# Patient Record
Sex: Female | Born: 1990 | Race: White | Hispanic: No | Marital: Single | Smoking: Current every day smoker
Health system: Southern US, Community
[De-identification: ages and names within clinical notes are randomized; demographics above are authoritative.]

## PROBLEM LIST (undated history)

## (undated) DIAGNOSIS — N83209 Unspecified ovarian cyst, unspecified side: Secondary | ICD-10-CM

## (undated) DIAGNOSIS — N289 Disorder of kidney and ureter, unspecified: Secondary | ICD-10-CM

## (undated) DIAGNOSIS — N390 Urinary tract infection, site not specified: Secondary | ICD-10-CM

## (undated) HISTORY — PX: KNEE SURGERY: SHX244

---
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
## 2011-02-15 ENCOUNTER — Emergency Department (HOSPITAL_BASED_OUTPATIENT_CLINIC_OR_DEPARTMENT_OTHER)
Admission: EM | Admit: 2011-02-15 | Discharge: 2011-02-16 | Disposition: A | Discharge status: Home / Self Care | Payer: Self-pay | Attending: Emergency Medicine | Admitting: Emergency Medicine

## 2011-02-15 ENCOUNTER — Encounter (HOSPITAL_BASED_OUTPATIENT_CLINIC_OR_DEPARTMENT_OTHER): Payer: Self-pay | Admitting: Emergency Medicine

## 2011-02-15 ENCOUNTER — Emergency Department (INDEPENDENT_AMBULATORY_CARE_PROVIDER_SITE_OTHER): Payer: Self-pay

## 2011-02-15 DIAGNOSIS — S41159A Open bite of unspecified upper arm, initial encounter: Secondary | ICD-10-CM

## 2011-02-15 DIAGNOSIS — F17200 Nicotine dependence, unspecified, uncomplicated: Secondary | ICD-10-CM | POA: Insufficient documentation

## 2011-02-15 DIAGNOSIS — Z23 Encounter for immunization: Secondary | ICD-10-CM | POA: Insufficient documentation

## 2011-02-15 DIAGNOSIS — M7989 Other specified soft tissue disorders: Secondary | ICD-10-CM

## 2011-02-15 DIAGNOSIS — S5000XA Contusion of unspecified elbow, initial encounter: Secondary | ICD-10-CM

## 2011-02-15 DIAGNOSIS — W540XXA Bitten by dog, initial encounter: Secondary | ICD-10-CM

## 2011-02-15 DIAGNOSIS — S51009A Unspecified open wound of unspecified elbow, initial encounter: Secondary | ICD-10-CM | POA: Insufficient documentation

## 2011-02-15 DIAGNOSIS — Y92009 Unspecified place in unspecified non-institutional (private) residence as the place of occurrence of the external cause: Secondary | ICD-10-CM | POA: Insufficient documentation

## 2011-02-15 DIAGNOSIS — Y998 Other external cause status: Secondary | ICD-10-CM | POA: Insufficient documentation

## 2011-02-15 DIAGNOSIS — S6990XA Unspecified injury of unspecified wrist, hand and finger(s), initial encounter: Secondary | ICD-10-CM

## 2011-02-15 LAB — CBC
MCH: 31.3 pg (ref 26.0–34.0)
MCV: 91.9 fL (ref 78.0–100.0)
WBC: 8.5 10*3/uL (ref 4.0–10.5)

## 2011-02-15 MED ORDER — RABIES IMMUNE GLOBULIN 150 UNIT/ML IM INJ
20.0000 [IU]/kg | INJECTION | Freq: Once | INTRAMUSCULAR | Status: AC
  Administered 2011-02-16: 1350 [IU]
  Filled 2011-02-15: qty 10

## 2011-02-15 MED ORDER — SODIUM CHLORIDE 0.9 % IV SOLN
3.0000 g | Freq: Once | INTRAVENOUS | Status: AC
  Administered 2011-02-15: 3 g via INTRAVENOUS
  Filled 2011-02-15: qty 3

## 2011-02-15 MED ORDER — OXYCODONE-ACETAMINOPHEN 5-325 MG PO TABS: 2.0000 | ORAL_TABLET | ORAL | Status: AC | PRN

## 2011-02-15 MED ORDER — RABIES VACCINE, PCEC IM SUSR
1.0000 mL | Freq: Once | INTRAMUSCULAR | Status: AC
  Administered 2011-02-16: 1 mL via INTRAMUSCULAR
  Filled 2011-02-15: qty 1

## 2011-02-15 MED ORDER — AMOXICILLIN-POT CLAVULANATE 875-125 MG PO TABS: 1.0000 | ORAL_TABLET | Freq: Two times a day (BID) | ORAL | Status: AC

## 2011-02-15 MED ORDER — SODIUM CHLORIDE 0.9 % IV BOLUS (SEPSIS)
1000.0000 mL | Freq: Once | INTRAVENOUS | Status: AC
  Administered 2011-02-15: 1000 mL via INTRAVENOUS

## 2011-02-15 MED ORDER — TETANUS-DIPHTH-ACELL PERTUSSIS 5-2.5-18.5 LF-MCG/0.5 IM SUSP
0.5000 mL | Freq: Once | INTRAMUSCULAR | Status: AC
  Administered 2011-02-15: 0.5 mL via INTRAMUSCULAR
  Filled 2011-02-15: qty 0.5

## 2011-02-15 NOTE — ED Notes (Signed)
[facility] at bedside 

## 2011-02-15 NOTE — ED Notes (Signed)
Pt sts she was at a friend's house last evening when the dog bit her. Pt sts the dog's owner told her that the dog's shots are current but would not tell her the house number on [address]. She got the dog's owner, Minerva Areola, on the phone to speak to me. I explained who I was and that I was required by law to report the dog bite and I needed to know his house number. Minerva Areola became belligerent and stated that he would tell the police his address but not me. I then asked if the dog's rabies shots were up to date and he stated that they were. I then asked him if he had paperwork to prove to the police that the dog has current rabies vaccination. He then broke into a tirade about how he was bitten by a dog once and he did not require treatment and then sts "I know how hospitals are. Y'all are going to milk here for all the money you can." At this point I ended the phone call with Minerva Areola.

## 2011-02-15 NOTE — ED Notes (Signed)
Animal bite reported to [facility].

## 2011-02-15 NOTE — ED Provider Notes (Signed)
History     CSN: [id number]  Arrival date & time 02/15/11  2121   First MD Initiated Contact with Patient 02/15/11 2226      Chief Complaint  Patient presents with  . Animal Bite   this is a healthy 21 year old female that was bitten by a pit spell yesterday in her right upper extremity. This happened last night, approximately 24 hours ago. She states the dog belonged to a friend and the dog had all of its shots up to date. Initially, there was no significant pain. There was one bite at the right lateral wrist. On the anterior portion and another bite at the elbow. She did go to work today and finished her shift. However, he states the bruising has become more extensive around the elbow. There is also been some mild redness around the wounds. Patient has had no fever. No significant streaking in the arm. No nausea, vomiting, or weakness. Denies any numbness or tingling. She did cleanse the wounds at home prior to coming in.  (Consider location/radiation/quality/duration/timing/severity/associated sxs/prior treatment) HPI  History reviewed. No pertinent past medical history.  Past Surgical History  Procedure Date  . Knee surgery     No family history on file.  History  Substance Use Topics  . Smoking status: Current Everyday Smoker  . Smokeless tobacco: Not on file  . Alcohol Use: Yes    OB History    Grav Para Term Preterm Abortions TAB SAB Ect Mult Living                  Review of Systems  All other systems reviewed and are negative.    Allergies  Review of patient's allergies indicates no known allergies.  Home Medications   Current Outpatient Rx  Name Route Sig Dispense Refill  . BIOTIN 10 MG PO TABS Oral Take 2 tablets by mouth daily.      BP 156/83  Pulse 93  Temp 98.5 F (36.9 C)  Resp 18  Ht 5\' 8"  (1.727 m)  Wt 150 lb (68.04 kg)  BMI 22.81 kg/m2  SpO2 99%  Physical Exam  Nursing note and vitals reviewed. Constitutional: She is oriented to  person, place, and time. She appears well-developed and well-nourished.  HENT:  Head: Normocephalic and atraumatic.  Eyes: Conjunctivae and EOM are normal. Pupils are equal, round, and reactive to light.  Neck: Neck supple.  Cardiovascular: Normal rate and regular rhythm.  Exam reveals no gallop and no friction rub.   No murmur heard. Pulmonary/Chest: Breath sounds normal. She has no wheezes. She has no rales. She exhibits no tenderness.  Abdominal: Soft. Bowel sounds are normal. She exhibits no distension. There is no tenderness. There is no rebound and no guarding.  Musculoskeletal: Normal range of motion.       Significant bruising around the right elbow with bite marks noted. Mild erythema around the bite marks. No obvious foreign body. No crepitus. Peripheral pulses and cap refill are normal.  Neurological: She is alert and oriented to person, place, and time. No cranial nerve deficit. Coordination normal.  Skin: Skin is warm and dry. No rash noted.       2 primary bite wounds to the right upper extremity, one around the wrist, and one around the antecubital area. Please see musculoskeletal exam  Psychiatric: She has a normal mood and affect.    ED Course  Procedures (including critical care time)   Labs Reviewed  CBC   No results found.  No diagnosis found.    MDM  Pt is seen and examined;  Initial history and physical completed.  Will follow.      10:48 PM  Tetanus booster, IV fluids, IV Unasyn. X-rays. And will provide extensive wound care. I feel more than likely will be able to discharge. The patient home with instructions to return in the morning for a wound check.  11:17 PM  The police have come in to talk with patient. Apparently, the police checked the dog actually did not have its shots. As, such we'll give rabies immunoglobulin and rabies vaccine.  Results for orders placed during the hospital encounter of 02/15/11  CBC      Component Value Range   WBC  8.5  4.0 - 10.5 (K/uL)   RBC 4.31  3.87 - 5.11 (MIL/uL)   Hemoglobin 13.5  12.0 - 15.0 (g/dL)   HCT 04.5  40.9 - 81.1 (%)   MCV 91.9  78.0 - 100.0 (fL)   MCH 31.3  26.0 - 34.0 (pg)   MCHC 34.1  30.0 - 36.0 (g/dL)   RDW 91.4  78.2 - 95.6 (%)   Platelets 239  150 - 400 (K/uL)   Dg Elbow Complete Right  02/15/2011  *RADIOLOGY REPORT*  Clinical Data: Bit by dog on right elbow; right elbow swelling and bruising.  RIGHT ELBOW - COMPLETE 3+ VIEW  Comparison: None.  Findings: There is no evidence of fracture or dislocation.  The visualized joint spaces are preserved.  No significant joint effusion is identified.  Mild soft tissue air is noted at the antecubital fossa.  IMPRESSION:  1.  No evidence of fracture or dislocation. 2.  Mild soft tissue air noted at the antecubital fossa.  Original Report Authenticated By: Tonia Ghent, M.D.      Burt Piatek A. Patrica Duel, MD 02/15/11 2317

## 2011-02-15 NOTE — ED Notes (Signed)
Pt has dog bite to right arm in Anticubital area and right forearm. Incident occurred last night. No active bleeding. Swelling and bruising noted.

## 2011-02-16 ENCOUNTER — Emergency Department (HOSPITAL_BASED_OUTPATIENT_CLINIC_OR_DEPARTMENT_OTHER)
Admission: EM | Admit: 2011-02-16 | Discharge: 2011-02-16 | Disposition: A | Discharge status: Home / Self Care | Payer: Self-pay | Attending: Emergency Medicine | Admitting: Emergency Medicine

## 2011-02-16 ENCOUNTER — Encounter (HOSPITAL_BASED_OUTPATIENT_CLINIC_OR_DEPARTMENT_OTHER): Payer: Self-pay | Admitting: *Deleted

## 2011-02-16 DIAGNOSIS — F17200 Nicotine dependence, unspecified, uncomplicated: Secondary | ICD-10-CM | POA: Insufficient documentation

## 2011-02-16 DIAGNOSIS — Z09 Encounter for follow-up examination after completed treatment for conditions other than malignant neoplasm: Secondary | ICD-10-CM | POA: Insufficient documentation

## 2011-02-16 DIAGNOSIS — Z5189 Encounter for other specified aftercare: Secondary | ICD-10-CM

## 2011-02-16 NOTE — ED Notes (Signed)
Pt seen here last night for dog bite here for f/u, did not get abx

## 2011-02-16 NOTE — ED Notes (Signed)
Pt verbalized understanding to return here tomorrow for wound check. Pt also verbalized understanding of when to f/u at [facility] for continuation of rabies vaccines.

## 2011-02-16 NOTE — ED Provider Notes (Signed)
History     CSN: [id number]  Arrival date & time 02/16/11  2224   First MD Initiated Contact with Patient 02/16/11 2255      Chief Complaint  Patient presents with  . Wound Check    (Consider location/radiation/quality/duration/timing/severity/associated sxs/prior treatment) HPI Comments: Pt returns for recheck of her R arm wounds that occurred approximately 48 hours ago.  According to prior medical record and pt's report, she had been attacked by a Community education officer that was at her friends house - there was some concern and inconsistencies in stories re: vaccination status of animal, thus she was updated on tetanus and rabies shots at visit 24 hours ago - She states that since her visit last night where she was given Unasyn, she has improved and has minimal pain, no numbness or tingling in the hand and normal function of the hand and fingers without pain.  There is decreased redness, decrease swelling though the bruising has spread somewhat down her arm. She denies fevers chills nausea vomiting. Symptoms are mild and improving. She denies getting her antibiotics filled at this time but states that she wants to get them filled on her way home from the hospital this evening.  Patient is a 21 y.o. female presenting with wound check. The history is provided by the patient and medical records.  Wound Check     History reviewed. No pertinent past medical history.  Past Surgical History  Procedure Date  . Knee surgery     History reviewed. No pertinent family history.  History  Substance Use Topics  . Smoking status: Current Everyday Smoker  . Smokeless tobacco: Not on file  . Alcohol Use: Yes    OB History    Grav Para Term Preterm Abortions TAB SAB Ect Mult Living                  Review of Systems  Constitutional: Negative for fever and chills.  Gastrointestinal: Negative for nausea and vomiting.  Skin: Positive for wound (improving and healing).    Allergies  Review of  patient's allergies indicates no known allergies.  Home Medications   Current Outpatient Rx  Name Route Sig Dispense Refill  . AMOXICILLIN-POT CLAVULANATE 875-125 MG PO TABS Oral Take 1 tablet by mouth every 12 (twelve) hours. 14 tablet 0  . BIOTIN 10 MG PO TABS Oral Take 2 tablets by mouth daily.    . OXYCODONE-ACETAMINOPHEN 5-325 MG PO TABS Oral Take 2 tablets by mouth every 4 (four) hours as needed for pain. 15 tablet 0    BP 134/85  Pulse 64  Temp 98 F (36.7 C)  Resp 16  Wt 150 lb (68.04 kg)  SpO2 100%  Physical Exam  Constitutional: She appears well-developed and well-nourished.  HENT:  Head: Normocephalic and atraumatic.  Eyes: Conjunctivae are normal. Right eye exhibits no discharge. Left eye exhibits no discharge. No scleral icterus.  Cardiovascular: Normal rate, regular rhythm and normal heart sounds.   Pulmonary/Chest: Effort normal and breath sounds normal. No respiratory distress. She has no wheezes. She has no rales.  Musculoskeletal: Normal range of motion. She exhibits tenderness (minimal tenderness to the right proximal forearm. Mild bruising in this area.). She exhibits no edema.  Skin: Skin is warm and dry.       Bite marks on the antecubital fossa and volar distal forearm with minimal erythema, no surrounding tenderness or discharge.    ED Course  Procedures (including critical care time)  Labs Reviewed - No  data to display Dg Elbow Complete Right  02/15/2011  *RADIOLOGY REPORT*  Clinical Data: Bit by dog on right elbow; right elbow swelling and bruising.  RIGHT ELBOW - COMPLETE 3+ VIEW  Comparison: None.  Findings: There is no evidence of fracture or dislocation.  The visualized joint spaces are preserved.  No significant joint effusion is identified.  Mild soft tissue air is noted at the antecubital fossa.  IMPRESSION:  1.  No evidence of fracture or dislocation. 2.  Mild soft tissue air noted at the antecubital fossa.  Original Report Authenticated By:  Tonia Ghent, M.D.     1. Visit for wound check       MDM  Patient has normal vital signs, appears to have healing wounds, feels comfortable and states that she does not want medication here when offered including next dose of antibiotic. She admits to still having the prescription in her possession and wants to see the 24-hour pharmacy on her way home tonight. Advised for further wound checks as needed and ongoing rabies vaccinations per the schedule. She is boisterous understanding and agreement with plan        Vida Roller, MD 02/16/11 [phone]

## 2011-02-20 ENCOUNTER — Emergency Department (HOSPITAL_COMMUNITY)
Admission: EM | Admit: 2011-02-20 | Discharge: 2011-02-20 | Disposition: A | Discharge status: Home / Self Care | Payer: Self-pay | Source: Home / Self Care

## 2011-02-20 ENCOUNTER — Encounter (HOSPITAL_COMMUNITY): Payer: Self-pay

## 2011-02-20 MED ORDER — RABIES VACCINE, PCEC IM SUSR: 1.0000 mL | Freq: Once | INTRAMUSCULAR | Status: DC

## 2011-02-20 MED ORDER — RABIES VACCINE, PCEC IM SUSR
INTRAMUSCULAR | Status: AC
  Filled 2011-02-20: qty 1

## 2011-02-20 NOTE — ED Notes (Signed)
Pt here to have rabies injection.

## 2011-02-23 ENCOUNTER — Emergency Department (HOSPITAL_COMMUNITY)
Admission: EM | Admit: 2011-02-23 | Discharge: 2011-02-23 | Disposition: A | Discharge status: Home / Self Care | Payer: PRIVATE HEALTH INSURANCE | Source: Home / Self Care

## 2011-02-23 ENCOUNTER — Encounter (HOSPITAL_COMMUNITY): Payer: Self-pay | Admitting: *Deleted

## 2011-02-23 MED ORDER — RABIES VACCINE, PCEC IM SUSR
1.0000 mL | Freq: Once | INTRAMUSCULAR | Status: AC
  Administered 2011-02-23: 1 mL via INTRAMUSCULAR

## 2011-02-23 MED ORDER — RABIES VACCINE, PCEC IM SUSR
INTRAMUSCULAR | Status: AC
  Filled 2011-02-23: qty 1

## 2011-02-23 NOTE — ED Notes (Signed)
[  pt here for rabies vaccine

## 2011-03-02 ENCOUNTER — Emergency Department (INDEPENDENT_AMBULATORY_CARE_PROVIDER_SITE_OTHER)
Admission: EM | Admit: 2011-03-02 | Discharge: 2011-03-02 | Disposition: A | Discharge status: Home / Self Care | Payer: PRIVATE HEALTH INSURANCE | Source: Home / Self Care

## 2011-03-02 ENCOUNTER — Encounter (HOSPITAL_COMMUNITY): Payer: Self-pay | Admitting: *Deleted

## 2011-03-02 DIAGNOSIS — Z23 Encounter for immunization: Secondary | ICD-10-CM

## 2011-03-02 MED ORDER — RABIES VACCINE, PCEC IM SUSR
1.0000 mL | Freq: Once | INTRAMUSCULAR | Status: AC
  Administered 2011-03-02: 1 mL via INTRAMUSCULAR

## 2011-03-02 MED ORDER — RABIES VACCINE, PCEC IM SUSR
INTRAMUSCULAR | Status: AC
  Filled 2011-03-02: qty 1

## 2011-03-02 NOTE — ED Notes (Signed)
Pt. here for last rabies vaccine for dog bite to R arm.  Wounds appear to be healing.  No signs of infection.

## 2012-10-17 ENCOUNTER — Emergency Department (HOSPITAL_BASED_OUTPATIENT_CLINIC_OR_DEPARTMENT_OTHER)
Admission: EM | Admit: 2012-10-17 | Discharge: 2012-10-17 | Disposition: A | Discharge status: Home / Self Care | Payer: PRIVATE HEALTH INSURANCE | Attending: Emergency Medicine | Admitting: Emergency Medicine

## 2012-10-17 ENCOUNTER — Encounter (HOSPITAL_BASED_OUTPATIENT_CLINIC_OR_DEPARTMENT_OTHER): Payer: Self-pay | Admitting: *Deleted

## 2012-10-17 DIAGNOSIS — N898 Other specified noninflammatory disorders of vagina: Secondary | ICD-10-CM | POA: Insufficient documentation

## 2012-10-17 DIAGNOSIS — F17200 Nicotine dependence, unspecified, uncomplicated: Secondary | ICD-10-CM | POA: Insufficient documentation

## 2012-10-17 DIAGNOSIS — Z3202 Encounter for pregnancy test, result negative: Secondary | ICD-10-CM | POA: Insufficient documentation

## 2012-10-17 DIAGNOSIS — Z79899 Other long term (current) drug therapy: Secondary | ICD-10-CM | POA: Insufficient documentation

## 2012-10-17 LAB — URINALYSIS, ROUTINE W REFLEX MICROSCOPIC
Nitrite: NEGATIVE
Protein, ur: NEGATIVE mg/dL
Urobilinogen, UA: 0.2 mg/dL (ref 0.0–1.0)
pH: 7 (ref 5.0–8.0)

## 2012-10-17 LAB — WET PREP, GENITAL
Clue Cells Wet Prep HPF POC: NONE SEEN
Trich, Wet Prep: NONE SEEN

## 2012-10-17 LAB — URINE MICROSCOPIC-ADD ON

## 2012-10-17 LAB — PREGNANCY, URINE: Preg Test, Ur: NEGATIVE

## 2012-10-17 MED ORDER — AZITHROMYCIN 250 MG PO TABS
1000.0000 mg | ORAL_TABLET | Freq: Once | ORAL | Status: AC
  Administered 2012-10-17: 1000 mg via ORAL
  Filled 2012-10-17: qty 4

## 2012-10-17 MED ORDER — CEFTRIAXONE SODIUM 250 MG IJ SOLR
250.0000 mg | Freq: Once | INTRAMUSCULAR | Status: AC
  Administered 2012-10-17: 250 mg via INTRAMUSCULAR
  Filled 2012-10-17: qty 250

## 2012-10-17 NOTE — ED Notes (Signed)
Pelvic cart is at the bedside set up and ready for the doctor to use. 

## 2012-10-17 NOTE — ED Provider Notes (Signed)
CSN: [id number]     Arrival date & time 10/17/12  1818 History  This chart was scribed for Geoffery Lyons, MD by Clydene Laming, ED Scribe. This patient was seen in room MH01/MH01 and the patient's care was started at 6:52 PM.   Chief Complaint  Patient presents with  . Vaginal Discharge    The history is provided by the patient. No language interpreter was used.    HPI Comments: Karen Vincent is a 22 y.o. female who presents to the Emergency Department complaining of vaginal discharge onset one day ago. Pt states she is seeing some pink "spotting". Pt is sexually active. Pt has hx of bacterial vaginosis. Pt denies any other related medical symptoms.   History reviewed. No pertinent past medical history. Past Surgical History  Procedure Laterality Date  . Knee surgery     History reviewed. No pertinent family history. History  Substance Use Topics  . Smoking status: Current Every Day Smoker -- 0.50 packs/day    Types: Cigarettes  . Smokeless tobacco: Not on file  . Alcohol Use: Yes   OB History   Grav Para Term Preterm Abortions TAB SAB Ect Mult Living                 Review of Systems  Constitutional: Negative for fever and chills.  Genitourinary: Positive for vaginal discharge. Negative for vaginal bleeding and vaginal pain.  All other systems reviewed and are negative.    Allergies  Review of patient's allergies indicates no known allergies.  Home Medications   Current Outpatient Rx  Name  Route  Sig  Dispense  Refill  . Biotin 10 MG TABS   Oral   Take 2 tablets by mouth daily.          Triage Vitals: BP 132/71  Pulse 81  Temp(Src) 99 F (37.2 C) (Oral)  Resp 16  Ht 5\' 8"  (1.727 m)  Wt 145 lb (65.772 kg)  BMI 22.05 kg/m2  SpO2 100%  LMP 09/24/2012 Physical Exam  Nursing note and vitals reviewed. Constitutional: She is oriented to person, place, and time. She appears well-developed and well-nourished. No distress.  HENT:  Head: Normocephalic and  atraumatic.  Eyes: Conjunctivae and EOM are normal.  Neck: Normal range of motion. Neck supple. No tracheal deviation present.  Cardiovascular: Normal rate, regular rhythm and normal heart sounds.   No murmur heard. Pulmonary/Chest: Effort normal and breath sounds normal. No respiratory distress. She has no wheezes. She has no rales.  Abdominal: Soft. Bowel sounds are normal. There is no tenderness.  Genitourinary: Vagina normal and uterus normal. No vaginal discharge found.  There is slight pinkish tan discharge present. There is no cervical motion tenderness and no adnexal masses.  Musculoskeletal: Normal range of motion. She exhibits no edema.  Neurological: She is alert and oriented to person, place, and time. No cranial nerve deficit.  Skin: Skin is warm and dry.  Psychiatric: She has a normal mood and affect. Her behavior is normal.    ED Course  Procedures (including critical care time)  DIAGNOSTIC STUDIES: Oxygen Saturation is 100% on RA, normal by my interpretation.    COORDINATION OF CARE: 6:55 PM- Discussed treatment plan with pt at bedside. Pt verbalized understanding and agreement with plan.   Labs Review Labs Reviewed  URINALYSIS, ROUTINE W REFLEX MICROSCOPIC  PREGNANCY, URINE   Imaging Review No results found.  MDM  No diagnosis found. Patient presents here with complaints of pinkish vaginal discharge for the  past 24 hours. She denies any pain or discomfort. She is sexually active with one partner, however this does not appear to be an exclusive relationship. Workup today reveals negative pregnancy test, negative urinalysis, and wet prep that shows only moderate white cells. Will treat with Rocephin and Zithromax pending results of the GC and Chlamydia testing.   I personally performed the services described in this documentation, which was scribed in my presence. The recorded information has been reviewed and is accurate.       Geoffery Lyons, MD 10/17/12  1944

## 2012-10-17 NOTE — ED Notes (Signed)
Pt c/o vaginal discharge x 1 day. 

## 2012-10-17 NOTE — ED Notes (Signed)
Pt reports she is here to "be checked".  States she had a one time episode of spotting today and is concerned because she was possibly exposed to an STD.

## 2012-10-19 ENCOUNTER — Telehealth (HOSPITAL_COMMUNITY): Payer: Self-pay | Admitting: Emergency Medicine

## 2012-10-19 NOTE — ED Notes (Signed)
Patient has +Gonorrhea. °

## 2012-10-19 NOTE — ED Notes (Signed)
+  Gonorrhea. Patient treated with Rocephin and Zithromax. [facility] faxed. 

## 2013-02-07 ENCOUNTER — Emergency Department (HOSPITAL_BASED_OUTPATIENT_CLINIC_OR_DEPARTMENT_OTHER)
Admission: EM | Admit: 2013-02-07 | Discharge: 2013-02-07 | Disposition: A | Discharge status: Home / Self Care | Payer: PRIVATE HEALTH INSURANCE | Attending: Emergency Medicine | Admitting: Emergency Medicine

## 2013-02-07 ENCOUNTER — Encounter (HOSPITAL_BASED_OUTPATIENT_CLINIC_OR_DEPARTMENT_OTHER): Payer: Self-pay | Admitting: Emergency Medicine

## 2013-02-07 DIAGNOSIS — F17200 Nicotine dependence, unspecified, uncomplicated: Secondary | ICD-10-CM | POA: Insufficient documentation

## 2013-02-07 DIAGNOSIS — Z3202 Encounter for pregnancy test, result negative: Secondary | ICD-10-CM | POA: Insufficient documentation

## 2013-02-07 DIAGNOSIS — R1110 Vomiting, unspecified: Secondary | ICD-10-CM | POA: Insufficient documentation

## 2013-02-07 DIAGNOSIS — Z79899 Other long term (current) drug therapy: Secondary | ICD-10-CM | POA: Insufficient documentation

## 2013-02-07 DIAGNOSIS — R52 Pain, unspecified: Secondary | ICD-10-CM | POA: Insufficient documentation

## 2013-02-07 DIAGNOSIS — N390 Urinary tract infection, site not specified: Secondary | ICD-10-CM | POA: Insufficient documentation

## 2013-02-07 DIAGNOSIS — R6883 Chills (without fever): Secondary | ICD-10-CM | POA: Insufficient documentation

## 2013-02-07 LAB — URINE MICROSCOPIC-ADD ON

## 2013-02-07 LAB — URINALYSIS, ROUTINE W REFLEX MICROSCOPIC
Glucose, UA: NEGATIVE mg/dL
Ketones, ur: NEGATIVE mg/dL
NITRITE: NEGATIVE
PH: 5.5 (ref 5.0–8.0)
Protein, ur: NEGATIVE mg/dL
Specific Gravity, Urine: 1.028 (ref 1.005–1.030)
Urobilinogen, UA: 0.2 mg/dL (ref 0.0–1.0)

## 2013-02-07 LAB — PREGNANCY, URINE: Preg Test, Ur: NEGATIVE

## 2013-02-07 MED ORDER — IBUPROFEN 800 MG PO TABS: 800.0000 mg | ORAL_TABLET | Freq: Three times a day (TID) | ORAL | Status: DC | PRN

## 2013-02-07 MED ORDER — IBUPROFEN 400 MG PO TABS
600.0000 mg | ORAL_TABLET | Freq: Once | ORAL | Status: AC
  Administered 2013-02-07: 600 mg via ORAL
  Filled 2013-02-07 (×2): qty 1

## 2013-02-07 MED ORDER — NITROFURANTOIN MONOHYD MACRO 100 MG PO CAPS: 100.0000 mg | ORAL_CAPSULE | Freq: Two times a day (BID) | ORAL | Status: DC

## 2013-02-07 MED ORDER — ONDANSETRON HCL 4 MG PO TABS: 4.0000 mg | ORAL_TABLET | Freq: Four times a day (QID) | ORAL | Status: DC

## 2013-02-07 MED ORDER — ONDANSETRON 4 MG PO TBDP
4.0000 mg | ORAL_TABLET | Freq: Once | ORAL | Status: AC
  Administered 2013-02-07: 4 mg via ORAL
  Filled 2013-02-07: qty 1

## 2013-02-07 NOTE — ED Notes (Signed)
Urine not collected- pt unable to void

## 2013-02-07 NOTE — ED Notes (Signed)
Pt rpeorts she awakened last night with generalized body aches and has vomited x 3.  She also reports urinary frequency.

## 2013-02-07 NOTE — Discharge Instructions (Signed)
Nausea and Vomiting °Nausea is a sick feeling that often comes before throwing up (vomiting). Vomiting is a reflex where stomach contents come out of your mouth. Vomiting can cause severe loss of body fluids (dehydration). Children and elderly adults can become dehydrated quickly, especially if they also have diarrhea. Nausea and vomiting are symptoms of a condition or disease. It is important to find the cause of your symptoms. °CAUSES  °· Direct irritation of the stomach lining. This irritation can result from increased acid production (gastroesophageal reflux disease), infection, food poisoning, taking certain medicines (such as nonsteroidal anti-inflammatory drugs), alcohol use, or tobacco use. °· Signals from the brain. These signals could be caused by a headache, heat exposure, an inner ear disturbance, increased pressure in the brain from injury, infection, a tumor, or a concussion, pain, emotional stimulus, or metabolic problems. °· An obstruction in the gastrointestinal tract (bowel obstruction). °· Illnesses such as diabetes, hepatitis, gallbladder problems, appendicitis, kidney problems, cancer, sepsis, atypical symptoms of a heart attack, or eating disorders. °· Medical treatments such as chemotherapy and radiation. °· Receiving medicine that makes you sleep (general anesthetic) during surgery. °DIAGNOSIS °Your caregiver may ask for tests to be done if the problems do not improve after a few days. Tests may also be done if symptoms are severe or if the reason for the nausea and vomiting is not clear. Tests may include: °· Urine tests. °· Blood tests. °· Stool tests. °· Cultures (to look for evidence of infection). °· X-rays or other imaging studies. °Test results can help your caregiver make decisions about treatment or the need for additional tests. °TREATMENT °You need to stay well hydrated. Drink frequently but in small amounts. You may wish to drink water, sports drinks, clear broth, or eat frozen  ice pops or gelatin dessert to help stay hydrated. When you eat, eating slowly may help prevent nausea. There are also some antinausea medicines that may help prevent nausea. °HOME CARE INSTRUCTIONS  °· Take all medicine as directed by your caregiver. °· If you do not have an appetite, do not force yourself to eat. However, you must continue to drink fluids. °· If you have an appetite, eat a normal diet unless your caregiver tells you differently. °· Eat a variety of complex carbohydrates (rice, wheat, potatoes, bread), lean meats, yogurt, fruits, and vegetables. °· Avoid high-fat foods because they are more difficult to digest. °· Drink enough water and fluids to keep your urine clear or pale yellow. °· If you are dehydrated, ask your caregiver for specific rehydration instructions. Signs of dehydration may include: °· Severe thirst. °· Dry lips and mouth. °· Dizziness. °· Dark urine. °· Decreasing urine frequency and amount. °· Confusion. °· Rapid breathing or pulse. °SEEK IMMEDIATE MEDICAL CARE IF:  °· You have blood or brown flecks (like coffee grounds) in your vomit. °· You have black or bloody stools. °· You have a severe headache or stiff neck. °· You are confused. °· You have severe abdominal pain. °· You have chest pain or trouble breathing. °· You do not urinate at least once every 8 hours. °· You develop cold or clammy skin. °· You continue to vomit for longer than 24 to 48 hours. °· You have a fever. °MAKE SURE YOU:  °· Understand these instructions. °· Will watch your condition. °· Will get help right away if you are not doing well or get worse. °Document Released: 01/10/2005 Document Revised: 04/04/2011 Document Reviewed: 06/09/2010 °ExitCare® Patient Information ©2014 [employer]. ° °Urinary Tract Infection °Urinary   tract infections (UTIs) can develop anywhere along your urinary tract. Your urinary tract is your body's drainage system for removing wastes and extra water. Your urinary tract includes  two kidneys, two ureters, a bladder, and a urethra. Your kidneys are a pair of bean-shaped organs. Each kidney is about the size of your fist. They are located below your ribs, one on each side of your spine. °CAUSES °Infections are caused by microbes, which are microscopic organisms, including fungi, viruses, and bacteria. These organisms are so small that they can only be seen through a microscope. Bacteria are the microbes that most commonly cause UTIs. °SYMPTOMS  °Symptoms of UTIs may vary by age and gender of the patient and by the location of the infection. Symptoms in young women typically include a frequent and intense urge to urinate and a painful, burning feeling in the bladder or urethra during urination. Older women and men are more likely to be tired, shaky, and weak and have muscle aches and abdominal pain. A fever may mean the infection is in your kidneys. Other symptoms of a kidney infection include pain in your back or sides below the ribs, nausea, and vomiting. °DIAGNOSIS °To diagnose a UTI, your caregiver will ask you about your symptoms. Your caregiver also will ask to provide a urine sample. The urine sample will be tested for bacteria and white blood cells. White blood cells are made by your body to help fight infection. °TREATMENT  °Typically, UTIs can be treated with medication. Because most UTIs are caused by a bacterial infection, they usually can be treated with the use of antibiotics. The choice of antibiotic and length of treatment depend on your symptoms and the type of bacteria causing your infection. °HOME CARE INSTRUCTIONS °· If you were prescribed antibiotics, take them exactly as your caregiver instructs you. Finish the medication even if you feel better after you have only taken some of the medication. °· Drink enough water and fluids to keep your urine clear or pale yellow. °· Avoid caffeine, tea, and carbonated beverages. They tend to irritate your bladder. °· Empty your bladder  often. Avoid holding urine for long periods of time. °· Empty your bladder before and after sexual intercourse. °· After a bowel movement, women should cleanse from front to back. Use each tissue only once. °SEEK MEDICAL CARE IF:  °· You have back pain. °· You develop a fever. °· Your symptoms do not begin to resolve within 3 days. °SEEK IMMEDIATE MEDICAL CARE IF:  °· You have severe back pain or lower abdominal pain. °· You develop chills. °· You have nausea or vomiting. °· You have continued burning or discomfort with urination. °MAKE SURE YOU:  °· Understand these instructions. °· Will watch your condition. °· Will get help right away if you are not doing well or get worse. °Document Released: 10/20/2004 Document Revised: 07/12/2011 Document Reviewed: 02/18/2011 °ExitCare® Patient Information ©2014 [employer]. ° °

## 2013-02-07 NOTE — ED Provider Notes (Signed)
TIME SEEN: 10:30 AM  CHIEF COMPLAINT: Chills, body aches, urinary frequency, vomiting  HPI: Patient is a 23 year old female with no significant past medical history presents emergency department with chills, body aches, vomiting and urinary frequency that started last night. She states her roommate has similar symptoms and is also here in the emergency department. No fevers, diarrhea, abdominal pain. She denies any dysuria or hematuria. Her last period was 01/07/13.  No recent international travel, amide use, hospitalization. No vaginal discharge or bleeding.  ROS: See HPI Constitutional: no fever  Eyes: no drainage  ENT: no runny nose   Cardiovascular:  no chest pain  Resp: no SOB  GI: no vomiting GU: no dysuria Integumentary: no rash  Allergy: no hives  Musculoskeletal: no leg swelling  Neurological: no slurred speech ROS otherwise negative  PAST MEDICAL HISTORY/PAST SURGICAL HISTORY:  History reviewed. No pertinent past medical history.  MEDICATIONS:  Prior to Admission medications   Medication Sig Start Date End Date Taking? Authorizing Provider  Biotin 10 MG TABS Take 2 tablets by mouth daily.    Historical Provider, MD    ALLERGIES:  No Known Allergies  SOCIAL HISTORY:  History  Substance Use Topics  . Smoking status: Current Every Day Smoker -- 0.50 packs/day    Types: Cigarettes  . Smokeless tobacco: Not on file  . Alcohol Use: Yes    FAMILY HISTORY: No family history on file.  EXAM: BP 111/64  Pulse 90  Temp(Src) 98.5 F (36.9 C) (Oral)  Resp 16  Ht 5\' 8"  (1.727 m)  Wt 145 lb (65.772 kg)  BMI 22.05 kg/m2  SpO2 100%  LMP 01/17/2013 CONSTITUTIONAL: Alert and oriented and responds appropriately to questions. Well-appearing; well-nourished well-appearing, nontoxic HEAD: Normocephalic EYES: Conjunctivae clear, PERRL ENT: normal nose; no rhinorrhea; moist mucous membranes; pharynx without lesions noted NECK: Supple, no meningismus, no LAD  CARD: RRR; S1  and S2 appreciated; no murmurs, no clicks, no rubs, no gallops RESP: Normal chest excursion without splinting or tachypnea; breath sounds clear and equal bilaterally; no wheezes, no rhonchi, no rales,  ABD/GI: Normal bowel sounds; non-distended; soft, non-tender, no rebound, no guarding BACK:  The back appears normal and is non-tender to palpation, there is no CVA tenderness EXT: Normal ROM in all joints; non-tender to palpation; no edema; normal capillary refill; no cyanosis    SKIN: Normal color for age and race; warm NEURO: Moves all extremities equally PSYCH: The patient's mood and manner are appropriate. Grooming and personal hygiene are appropriate.  MEDICAL DECISION MAKING: Patient here with vomiting, chills and urinary frequency. We'll check urinalysis and urine pregnancy test. She is hemodynamically stable. She has a benign abdominal exam. She reports she is only had 3 episodes of nonbloody, nonbilious vomiting. She does not appear dehydrated on exam. I do not feel she needs labs or abdominal imaging at this time. We'll give ibuprofen and Zofran and reassess.  ED PROGRESS: Patient has blood, leukocytes, bacteria but also squamous cells in her urine. Urine culture pending. Her pregnancy test is negative. We'll discharge home with prescription for Macrobid and Zofran. Given return precautions. Patient verbalizes understanding is comfortable plan.     Layla MawKristen N Taya Ashbaugh, DO 02/07/13 1136

## 2013-02-08 ENCOUNTER — Encounter (HOSPITAL_BASED_OUTPATIENT_CLINIC_OR_DEPARTMENT_OTHER): Payer: Self-pay | Admitting: Emergency Medicine

## 2013-02-08 ENCOUNTER — Emergency Department (HOSPITAL_BASED_OUTPATIENT_CLINIC_OR_DEPARTMENT_OTHER)
Admission: EM | Admit: 2013-02-08 | Discharge: 2013-02-08 | Disposition: A | Discharge status: Home / Self Care | Payer: PRIVATE HEALTH INSURANCE | Attending: Emergency Medicine | Admitting: Emergency Medicine

## 2013-02-08 DIAGNOSIS — B9689 Other specified bacterial agents as the cause of diseases classified elsewhere: Secondary | ICD-10-CM | POA: Insufficient documentation

## 2013-02-08 DIAGNOSIS — R197 Diarrhea, unspecified: Secondary | ICD-10-CM | POA: Insufficient documentation

## 2013-02-08 DIAGNOSIS — R1110 Vomiting, unspecified: Secondary | ICD-10-CM | POA: Insufficient documentation

## 2013-02-08 DIAGNOSIS — Z87440 Personal history of urinary (tract) infections: Secondary | ICD-10-CM | POA: Insufficient documentation

## 2013-02-08 DIAGNOSIS — Z792 Long term (current) use of antibiotics: Secondary | ICD-10-CM | POA: Insufficient documentation

## 2013-02-08 DIAGNOSIS — N760 Acute vaginitis: Secondary | ICD-10-CM | POA: Insufficient documentation

## 2013-02-08 DIAGNOSIS — A499 Bacterial infection, unspecified: Secondary | ICD-10-CM | POA: Insufficient documentation

## 2013-02-08 DIAGNOSIS — F17200 Nicotine dependence, unspecified, uncomplicated: Secondary | ICD-10-CM | POA: Insufficient documentation

## 2013-02-08 DIAGNOSIS — Z5189 Encounter for other specified aftercare: Secondary | ICD-10-CM | POA: Insufficient documentation

## 2013-02-08 HISTORY — DX: Urinary tract infection, site not specified: N39.0

## 2013-02-08 LAB — URINE CULTURE: Colony Count: 50000

## 2013-02-08 LAB — WET PREP, GENITAL
TRICH WET PREP: NONE SEEN
YEAST WET PREP: NONE SEEN

## 2013-02-08 MED ORDER — METRONIDAZOLE 500 MG PO TABS
ORAL_TABLET | ORAL | Status: AC
Start: 2013-02-08 — End: 2013-02-08
  Administered 2013-02-08: 03:00:00 500 mg via ORAL
  Filled 2013-02-08: qty 1

## 2013-02-08 MED ORDER — METOCLOPRAMIDE HCL 10 MG PO TABS: 10.0000 mg | ORAL_TABLET | Freq: Four times a day (QID) | ORAL | Status: DC | PRN

## 2013-02-08 MED ORDER — ONDANSETRON 8 MG PO TBDP
8.0000 mg | ORAL_TABLET | Freq: Once | ORAL | Status: AC
  Administered 2013-02-08: 8 mg via ORAL
  Filled 2013-02-08: qty 1

## 2013-02-08 MED ORDER — ACETAMINOPHEN 500 MG PO TABS
1000.0000 mg | ORAL_TABLET | Freq: Once | ORAL | Status: AC
  Administered 2013-02-08: 1000 mg via ORAL
  Filled 2013-02-08: qty 2

## 2013-02-08 MED ORDER — METRONIDAZOLE 500 MG PO TABS
500.0000 mg | ORAL_TABLET | Freq: Once | ORAL | Status: AC
  Administered 2013-02-08: 500 mg via ORAL

## 2013-02-08 MED ORDER — METRONIDAZOLE 500 MG PO TABS: 500.0000 mg | ORAL_TABLET | Freq: Two times a day (BID) | ORAL | Status: DC

## 2013-02-08 NOTE — Discharge Instructions (Signed)

## 2013-02-08 NOTE — ED Notes (Signed)
Pt states here earlier today dx with UTI, took macrobid x1 then ate dinner and now having fevers, chills, vomiting, diarrhea

## 2013-02-08 NOTE — ED Provider Notes (Signed)
CSN: [id number]     Arrival date & time 02/08/13  0037 History   First MD Initiated Contact with Patient 02/08/13 0050     Chief Complaint  Patient presents with  . Emesis   (Consider location/radiation/quality/duration/timing/severity/associated sxs/prior Treatment) Patient is a 23 y.o. female presenting with vomiting. The history is provided by the patient. No language interpreter was used.  Emesis Severity:  Mild Timing:  Sporadic Quality:  Stomach contents Progression:  Unchanged Chronicity:  New Recent urination:  Normal Context: not post-tussive   Relieved by:  Nothing Worsened by:  Nothing tried Ineffective treatments:  None tried Associated symptoms: diarrhea   Diarrhea:    Quality:  Watery   Severity:  Moderate   Timing:  Sporadic   Progression:  Unchanged Risk factors: no sick contacts     Past Medical History  Diagnosis Date  . UTI (lower urinary tract infection)    Past Surgical History  Procedure Laterality Date  . Knee surgery     No family history on file. History  Substance Use Topics  . Smoking status: Current Every Day Smoker -- 0.50 packs/day    Types: Cigarettes  . Smokeless tobacco: Not on file  . Alcohol Use: Yes   OB History   Grav Para Term Preterm Abortions TAB SAB Ect Mult Living                 Review of Systems  Gastrointestinal: Positive for vomiting and diarrhea.  All other systems reviewed and are negative.    Allergies  Review of patient's allergies indicates no known allergies.  Home Medications   Current Outpatient Rx  Name  Route  Sig  Dispense  Refill  . Biotin 10 MG TABS   Oral   Take 2 tablets by mouth daily.         Marland Kitchen ibuprofen (ADVIL,MOTRIN) 800 MG tablet   Oral   Take 1 tablet (800 mg total) by mouth every 8 (eight) hours as needed for mild pain.   21 tablet   0   . nitrofurantoin, macrocrystal-monohydrate, (MACROBID) 100 MG capsule   Oral   Take 1 capsule (100 mg total) by mouth 2 (two) times  daily.   14 capsule   0   . ondansetron (ZOFRAN) 4 MG tablet   Oral   Take 1 tablet (4 mg total) by mouth every 6 (six) hours.   12 tablet   0    BP 114/98  Temp(Src) 100.3 F (37.9 C) (Oral)  Ht 5\' 8"  (1.727 m)  Wt 145 lb (65.772 kg)  BMI 22.05 kg/m2  SpO2 100%  LMP 01/17/2013 Physical Exam  Constitutional: She is oriented to person, place, and time. She appears well-developed and well-nourished. No distress.  HENT:  Head: Normocephalic and atraumatic.  Mouth/Throat: Oropharynx is clear and moist.  Eyes: Conjunctivae are normal. Pupils are equal, round, and reactive to light.  Neck: Normal range of motion. Neck supple.  Cardiovascular: Normal rate, regular rhythm and intact distal pulses.   Pulmonary/Chest: Effort normal and breath sounds normal. She has no wheezes. She has no rales.  Abdominal: Soft. Bowel sounds are normal. There is no tenderness. There is no rebound and no guarding.  Genitourinary:  Chaperone present no cmt nor adnexal tenderness  Musculoskeletal: Normal range of motion.  Neurological: She is alert and oriented to person, place, and time.  Skin: Skin is warm and dry.  Psychiatric: She has a normal mood and affect.    ED Course  Procedures (including critical care time) Labs Review Labs Reviewed  WET PREP, GENITAL - Abnormal; Notable for the following:    Clue Cells Wet Prep HPF POC MANY (*)    WBC, Wet Prep HPF POC MODERATE (*)    All other components within normal limits  GC/CHLAMYDIA PROBE AMP   Imaging Review No results found.  EKG Interpretation   None       MDM  No diagnosis found. Will treat for BV, apparently patient was already having diarrhea when seen earlier.  No indication for labs or imaging.  Exam benign patient is well appearing and sleeping upon entrance to the room Continue abx for UTI.  Take anti emetics.  Will add flagyl.      Jasmine AweApril K Elyce Zollinger-Rasch, MD 02/08/13 [phone]

## 2013-02-09 LAB — GC/CHLAMYDIA PROBE AMP
CT Probe RNA: NEGATIVE
GC Probe RNA: NEGATIVE

## 2013-03-10 ENCOUNTER — Emergency Department (HOSPITAL_BASED_OUTPATIENT_CLINIC_OR_DEPARTMENT_OTHER)
Admission: EM | Admit: 2013-03-10 | Discharge: 2013-03-10 | Disposition: A | Discharge status: Home / Self Care | Payer: PRIVATE HEALTH INSURANCE | Attending: Emergency Medicine | Admitting: Emergency Medicine

## 2013-03-10 ENCOUNTER — Encounter (HOSPITAL_BASED_OUTPATIENT_CLINIC_OR_DEPARTMENT_OTHER): Payer: Self-pay | Admitting: Emergency Medicine

## 2013-03-10 DIAGNOSIS — H109 Unspecified conjunctivitis: Secondary | ICD-10-CM | POA: Insufficient documentation

## 2013-03-10 DIAGNOSIS — F17200 Nicotine dependence, unspecified, uncomplicated: Secondary | ICD-10-CM | POA: Insufficient documentation

## 2013-03-10 DIAGNOSIS — Z87440 Personal history of urinary (tract) infections: Secondary | ICD-10-CM | POA: Insufficient documentation

## 2013-03-10 MED ORDER — ERYTHROMYCIN 5 MG/GM OP OINT
TOPICAL_OINTMENT | Freq: Once | OPHTHALMIC | Status: AC
  Administered 2013-03-10: 15:00:00 via OPHTHALMIC
  Filled 2013-03-10: qty 3.5

## 2013-03-10 MED ORDER — TETRACAINE HCL 0.5 % OP SOLN
2.0000 [drp] | Freq: Once | OPHTHALMIC | Status: DC
  Filled 2013-03-10: qty 2

## 2013-03-10 MED ORDER — FLUORESCEIN SODIUM 1 MG OP STRP
1.0000 | ORAL_STRIP | Freq: Once | OPHTHALMIC | Status: DC
  Filled 2013-03-10: qty 1

## 2013-03-10 NOTE — ED Provider Notes (Signed)
CSN: [id number]     Arrival date & time 03/10/13  1135 History   First MD Initiated Contact with Patient 03/10/13 1417     Chief Complaint  Patient presents with  . eye redness      (Consider location/radiation/quality/duration/timing/severity/associated sxs/prior Treatment) Patient is a 23 y.o. female presenting with conjunctivitis. The history is provided by the patient.  Conjunctivitis This is a new problem. The current episode started yesterday. The problem occurs constantly. The problem has been gradually worsening.   Karen Vincent is a 23 y.o. female who presents to the ED with right eye redness that started yesterday. She was at work and thought she may have gotten makeup in her eye but it has continued to have redness and itching.  Past Medical History  Diagnosis Date  . UTI (lower urinary tract infection)    Past Surgical History  Procedure Laterality Date  . Knee surgery     No family history on file. History  Substance Use Topics  . Smoking status: Current Every Day Smoker -- 0.50 packs/day    Types: Cigarettes  . Smokeless tobacco: Not on file  . Alcohol Use: Yes   OB History   Grav Para Term Preterm Abortions TAB SAB Ect Mult Living                 Review of Systems Negative except as stated in HPI   Allergies  Review of patient's allergies indicates no known allergies.  Home Medications  No current outpatient prescriptions on file. BP 127/53  Pulse 58  Temp(Src) 99.1 F (37.3 C) (Oral)  Resp 18  SpO2 100% Physical Exam  Nursing note and vitals reviewed. Constitutional: She is oriented to person, place, and time. She appears well-developed and well-nourished. No distress.  Eyes: EOM are normal. Pupils are equal, round, and reactive to light. Right eye exhibits discharge. Right eye exhibits no hordeolum. No foreign body present in the right eye. Right conjunctiva is injected. Right eye exhibits normal extraocular motion and no nystagmus.   Fundoscopic exam:      The right eye shows red reflex.  Slit lamp exam:      The right eye shows no corneal abrasion, no foreign body and no fluorescein uptake.  Neck: Neck supple.  Cardiovascular: Normal rate.   Pulmonary/Chest: Effort normal.  Abdominal: Soft. There is no tenderness.  Musculoskeletal: Normal range of motion.  Neurological: She is alert and oriented to person, place, and time. No cranial nerve deficit.  Skin: Skin is warm and dry.  Psychiatric: She has a normal mood and affect. Her behavior is normal.    ED Course  Procedures  Visual acuity normal. MDM  23 y.o. female with right eye irritation and drainage. Will treat with antibiotic eye ointment. Erythromycin Opth oint. First dose applied here in the ED. Patient stable for discharge and will follow up with Opt homology if symptoms persist.  Discussed with the patient and all questioned fully answered. She will return if any problems arise.      AtglenHope M Mykayla Brinton, [place]NP 03/11/13 2140

## 2013-03-10 NOTE — ED Notes (Signed)
Patient here with right eye redness and drainage x 1 day, no other complaints

## 2013-03-10 NOTE — Discharge Instructions (Signed)
Use the eye ointment three times a day for the next 5 days. Apply cool compresses to the eye and take ibuprofen as needed for pain and inflammation.  If symptoms persist follow up with Dr. Karleen HampshireSpencer. Return here as needed.    Conjunctivitis Conjunctivitis is commonly called "pink eye." Conjunctivitis can be caused by bacterial or viral infection, allergies, or injuries. There is usually redness of the lining of the eye, itching, discomfort, and sometimes discharge. There may be deposits of matter along the eyelids. A viral infection usually causes a watery discharge, while a bacterial infection causes a yellowish, thick discharge. Pink eye is very contagious and spreads by direct contact. You may be given antibiotic eyedrops as part of your treatment. Before using your eye medicine, remove all drainage from the eye by washing gently with warm water and cotton balls. Continue to use the medication until you have awakened 2 mornings in a row without discharge from the eye. Do not rub your eye. This increases the irritation and helps spread infection. Use separate towels from other household members. Wash your hands with soap and water before and after touching your eyes. Use cold compresses to reduce pain and sunglasses to relieve irritation from light. Do not wear contact lenses or wear eye makeup until the infection is gone. SEEK MEDICAL CARE IF:   Your symptoms are not better after 3 days of treatment.  You have increased pain or trouble seeing.  The outer eyelids become very red or swollen. Document Released: 02/18/2004 Document Revised: 04/04/2011 Document Reviewed: 01/10/2005 [facility] Patient Information 2014 [place], [place].

## 2013-03-13 ENCOUNTER — Encounter (HOSPITAL_BASED_OUTPATIENT_CLINIC_OR_DEPARTMENT_OTHER): Payer: Self-pay | Admitting: Emergency Medicine

## 2013-03-13 ENCOUNTER — Emergency Department (HOSPITAL_BASED_OUTPATIENT_CLINIC_OR_DEPARTMENT_OTHER)
Admission: EM | Admit: 2013-03-13 | Discharge: 2013-03-13 | Disposition: A | Discharge status: Home / Self Care | Payer: PRIVATE HEALTH INSURANCE | Attending: Emergency Medicine | Admitting: Emergency Medicine

## 2013-03-13 DIAGNOSIS — Z87440 Personal history of urinary (tract) infections: Secondary | ICD-10-CM | POA: Insufficient documentation

## 2013-03-13 DIAGNOSIS — Z792 Long term (current) use of antibiotics: Secondary | ICD-10-CM | POA: Insufficient documentation

## 2013-03-13 DIAGNOSIS — H109 Unspecified conjunctivitis: Secondary | ICD-10-CM

## 2013-03-13 DIAGNOSIS — F17200 Nicotine dependence, unspecified, uncomplicated: Secondary | ICD-10-CM | POA: Insufficient documentation

## 2013-03-13 MED ORDER — CIPROFLOXACIN HCL 0.3 % OP SOLN: 1.0000 [drp] | OPHTHALMIC | Status: AC

## 2013-03-13 NOTE — ED Provider Notes (Signed)
Medical screening examination/treatment/procedure(s) were performed by non-physician practitioner and as supervising physician I was immediately available for consultation/collaboration.  EKG Interpretation   None        Glynn OctaveStephen Temari Schooler, MD 03/13/13 1750

## 2013-03-13 NOTE — ED Notes (Signed)
Pt walked back to do the visual acuity stating "I don't need this test. It's a waist of my time. All I need is another antibiotic cause the last one you gave me made this worse. Then you can give someone else this hallway bed." Explained to the patient we didn't have to do the exam she could refuse that I was just following Dr. Jarrett Ablesrders.  Pt refused to do exam. Then states "I cant even hold my eye open to see the chart."  MD made aware

## 2013-03-13 NOTE — ED Provider Notes (Signed)
CSN: [id number]     Arrival date & time 03/13/13  1058 History   First MD Initiated Contact with Patient 03/13/13 1310     Chief Complaint  Patient presents with  . Eye Pain     (Consider location/radiation/quality/duration/timing/severity/associated sxs/prior Treatment) Patient is a 23 y.o. female presenting with eye pain. The history is provided by the patient. No language interpreter was used.  Eye Pain This is a new problem. Episode onset: 3 days. The problem occurs constantly. The problem has been unchanged. Nothing aggravates the symptoms. She has tried nothing for the symptoms. The treatment provided no relief.  Pt reports eye infection is getting worse since starting antibioticc on Sunday  Past Medical History  Diagnosis Date  . UTI (lower urinary tract infection)    Past Surgical History  Procedure Laterality Date  . Knee surgery     No family history on file. History  Substance Use Topics  . Smoking status: Current Every Day Smoker -- 0.50 packs/day    Types: Cigarettes  . Smokeless tobacco: Not on file  . Alcohol Use: Yes   OB History   Grav Para Term Preterm Abortions TAB SAB Ect Mult Living                 Review of Systems  Eyes: Positive for pain.  All other systems reviewed and are negative.      Allergies  Review of patient's allergies indicates no known allergies.  Home Medications   Current Outpatient Rx  Name  Route  Sig  Dispense  Refill  . erythromycin ophthalmic ointment      1 application 3 (three) times daily.         . ciprofloxacin (CILOXAN) 0.3 % ophthalmic solution   Right Eye   Place 1 drop into the right eye every 2 (two) hours. Administer 1 drop, every 2 hours, while awake, for 2 days. Then 1 drop, every 4 hours, while awake, for the next 5 days.   5 mL   0    BP 106/55  Pulse 75  Temp(Src) 98.2 F (36.8 C) (Oral)  Resp 16  SpO2 100%  LMP 03/13/2013 Physical Exam  Constitutional: She appears well-developed and  well-nourished.  Eyes: Conjunctivae and EOM are normal. Pupils are equal, round, and reactive to light. Right eye exhibits discharge.  Injected conjunctiva,  Swollen eyelids  Neurological: She is alert.  Skin: Skin is warm.  Psychiatric: She has a normal mood and affect.    ED Course  Procedures (including critical care time) Labs Review Labs Reviewed - No data to display Imaging Review No results found.  EKG Interpretation   None       MDM   Final diagnoses:  Conjunctivitis    Pt advised to stop erythromycin.   Pt given rx for ciloxin.   Pt advised to follow up with OPtho,ology is not improving   Elson AreasLeslie K Sofia, PA-C 03/13/13 1330

## 2013-03-13 NOTE — ED Notes (Signed)
Pt seen here on Sunday for same. Given ointment and has used for three days and symptoms are worse.swelling and draining

## 2013-03-13 NOTE — Discharge Instructions (Signed)

## 2013-03-18 ENCOUNTER — Emergency Department (HOSPITAL_BASED_OUTPATIENT_CLINIC_OR_DEPARTMENT_OTHER)
Admission: EM | Admit: 2013-03-18 | Discharge: 2013-03-18 | Disposition: A | Discharge status: Home / Self Care | Payer: PRIVATE HEALTH INSURANCE | Attending: Emergency Medicine | Admitting: Emergency Medicine

## 2013-03-18 ENCOUNTER — Encounter (HOSPITAL_BASED_OUTPATIENT_CLINIC_OR_DEPARTMENT_OTHER): Payer: Self-pay | Admitting: Emergency Medicine

## 2013-03-18 DIAGNOSIS — H109 Unspecified conjunctivitis: Secondary | ICD-10-CM | POA: Insufficient documentation

## 2013-03-18 DIAGNOSIS — F17200 Nicotine dependence, unspecified, uncomplicated: Secondary | ICD-10-CM | POA: Insufficient documentation

## 2013-03-18 DIAGNOSIS — Z87440 Personal history of urinary (tract) infections: Secondary | ICD-10-CM | POA: Insufficient documentation

## 2013-03-18 MED ORDER — AMOXICILLIN 500 MG PO CAPS: 500.0000 mg | ORAL_CAPSULE | Freq: Three times a day (TID) | ORAL | Status: AC

## 2013-03-18 NOTE — ED Notes (Signed)
Delay in triage due to pt on the phone-VS taken while pt continues to talk on phone-advised pt I would complete check in when she completes call

## 2013-03-18 NOTE — ED Notes (Signed)
Pt returned after d/c to request RTW note for 2-3 days-EDNP Pickering notified-pt notified she had been on abx drop/ointment for  5 days well past the 24 hr abx recommendation and was ok to return to work per Medco Health SolutionsEDNP advisement

## 2013-03-18 NOTE — ED Provider Notes (Signed)
Medical screening examination/treatment/procedure(s) were performed by non-physician practitioner and as supervising physician I was immediately available for consultation/collaboration.  EKG Interpretation   None         Ritter Helsley N Sienna Stonehocker, DO 03/18/13 1719 

## 2013-03-18 NOTE — ED Provider Notes (Signed)
Medical screening examination/treatment/procedure(s) were performed by non-physician practitioner and as supervising physician I was immediately available for consultation/collaboration.  EKG Interpretation   None        Martha K Linker, MD 03/18/13 0924 

## 2013-03-18 NOTE — ED Notes (Signed)
C/o pink eye to right eye-reports is 3rd visit here for same

## 2013-03-18 NOTE — ED Provider Notes (Signed)
CSN: [id number]     Arrival date & time 03/18/13  1123 History   First MD Initiated Contact with Patient 03/18/13 1130     Chief Complaint  Patient presents with  . Conjunctivitis     (Consider location/radiation/quality/duration/timing/severity/associated sxs/prior Treatment) HPI Comments: Pt states that she is here because she started with pink eye 1 week [facility]UJ:W[place]:they started her on erythromycin and it got worse. Pt was then started on cipro 5 days ago and it is getting better, but has not completely resolved. Denies blurred vision:pt states that she has also had right sided facial pain and congestion  The history is provided by the patient. No language interpreter was used.    Past Medical History  Diagnosis Date  . UTI (lower urinary tract infection)    Past Surgical History  Procedure Laterality Date  . Knee surgery     No family history on file. History  Substance Use Topics  . Smoking status: Current Every Day Smoker -- 0.50 packs/day    Types: Cigarettes  . Smokeless tobacco: Not on file  . Alcohol Use: Yes   OB History   Grav Para Term Preterm Abortions TAB SAB Ect Mult Living                 Review of Systems  Constitutional: Negative for fever.  Eyes: Positive for discharge.  Respiratory: Negative for shortness of breath.   Cardiovascular: Negative for chest pain.      Allergies  Review of patient's allergies indicates no known allergies.  Home Medications   Current Outpatient Rx  Name  Route  Sig  Dispense  Refill  . amoxicillin (AMOXIL) 500 MG capsule   Oral   Take 1 capsule (500 mg total) by mouth 3 (three) times daily.   30 capsule   0   . ciprofloxacin (CILOXAN) 0.3 % ophthalmic solution   Right Eye   Place 1 drop into the right eye every 2 (two) hours. Administer 1 drop, every 2 hours, while awake, for 2 days. Then 1 drop, every 4 hours, while awake, for the next 5 days.   5 mL   0   . erythromycin ophthalmic ointment      1 application 3  (three) times daily.          BP 126/82  Pulse 69  Temp(Src) 98.1 F (36.7 C)  Resp 16  SpO2 98%  LMP 03/13/2013 Physical Exam  Nursing note and vitals reviewed. Constitutional: She appears well-developed and well-nourished.  HENT:  Head: Normocephalic.  Left Ear: External ear normal.  Eyes: EOM are normal. Pupils are equal, round, and reactive to light. Right eye exhibits discharge. Right conjunctiva is injected.  Cardiovascular: Normal rate and regular rhythm.   Pulmonary/Chest: Effort normal and breath sounds normal.    ED Course  Procedures (including critical care time) Labs Review Labs Reviewed - No data to display Imaging Review No results found.  EKG Interpretation   None       MDM   Final diagnoses:  Conjunctivitis   Discussed with pt the importance of follow up with opthalmology. Pt verbalized understanding    Teressa LowerVrinda Jaydalynn Olivero, NP 03/18/13 1622

## 2013-03-18 NOTE — Discharge Instructions (Signed)

## 2014-01-11 ENCOUNTER — Encounter (HOSPITAL_BASED_OUTPATIENT_CLINIC_OR_DEPARTMENT_OTHER): Payer: Self-pay | Admitting: *Deleted

## 2014-01-11 ENCOUNTER — Emergency Department (HOSPITAL_BASED_OUTPATIENT_CLINIC_OR_DEPARTMENT_OTHER)
Admission: EM | Admit: 2014-01-11 | Discharge: 2014-01-11 | Disposition: A | Discharge status: Home / Self Care | Payer: PRIVATE HEALTH INSURANCE | Attending: Emergency Medicine | Admitting: Emergency Medicine

## 2014-01-11 DIAGNOSIS — M545 Low back pain, unspecified: Secondary | ICD-10-CM

## 2014-01-11 DIAGNOSIS — Z87448 Personal history of other diseases of urinary system: Secondary | ICD-10-CM | POA: Insufficient documentation

## 2014-01-11 DIAGNOSIS — Z720 Tobacco use: Secondary | ICD-10-CM | POA: Insufficient documentation

## 2014-01-11 DIAGNOSIS — B9689 Other specified bacterial agents as the cause of diseases classified elsewhere: Secondary | ICD-10-CM

## 2014-01-11 DIAGNOSIS — N760 Acute vaginitis: Secondary | ICD-10-CM

## 2014-01-11 DIAGNOSIS — Z87440 Personal history of urinary (tract) infections: Secondary | ICD-10-CM | POA: Insufficient documentation

## 2014-01-11 DIAGNOSIS — Z3202 Encounter for pregnancy test, result negative: Secondary | ICD-10-CM | POA: Insufficient documentation

## 2014-01-11 DIAGNOSIS — Z792 Long term (current) use of antibiotics: Secondary | ICD-10-CM | POA: Insufficient documentation

## 2014-01-11 HISTORY — DX: Disorder of kidney and ureter, unspecified: N28.9

## 2014-01-11 LAB — URINALYSIS, ROUTINE W REFLEX MICROSCOPIC
Bilirubin Urine: NEGATIVE
GLUCOSE, UA: NEGATIVE mg/dL
HGB URINE DIPSTICK: NEGATIVE
Ketones, ur: NEGATIVE mg/dL
Leukocytes, UA: NEGATIVE
Nitrite: NEGATIVE
Protein, ur: NEGATIVE mg/dL
SPECIFIC GRAVITY, URINE: 1.023 (ref 1.005–1.030)
UROBILINOGEN UA: 1 mg/dL (ref 0.0–1.0)
pH: 7 (ref 5.0–8.0)

## 2014-01-11 LAB — WET PREP, GENITAL
TRICH WET PREP: NONE SEEN
YEAST WET PREP: NONE SEEN

## 2014-01-11 LAB — PREGNANCY, URINE: Preg Test, Ur: NEGATIVE

## 2014-01-11 MED ORDER — IBUPROFEN 400 MG PO TABS: 400.0000 mg | ORAL_TABLET | Freq: Four times a day (QID) | ORAL | Status: AC | PRN

## 2014-01-11 MED ORDER — IBUPROFEN 400 MG PO TABS
400.0000 mg | ORAL_TABLET | Freq: Once | ORAL | Status: AC
  Administered 2014-01-11: 400 mg via ORAL
  Filled 2014-01-11: qty 1

## 2014-01-11 MED ORDER — METRONIDAZOLE 500 MG PO TABS: 500.0000 mg | ORAL_TABLET | Freq: Two times a day (BID) | ORAL | Status: AC

## 2014-01-11 NOTE — Discharge Instructions (Signed)
Bacterial Vaginosis Bacterial vaginosis is an infection of the vagina. It happens when too many of certain germs (bacteria) grow in the vagina. HOME CARE  Take your medicine as told by your doctor.  Finish your medicine even if you start to feel better.  Do not have sex until you finish your medicine and are better.  Tell your sex partner that you have an infection. They should see their doctor for treatment.  Practice safe sex. Use condoms. Have only one sex partner. GET HELP IF:  You are not getting better after 3 days of treatment.  You have more grey fluid (discharge) coming from your vagina than before.  You have more pain than before.  You have a fever. MAKE SURE YOU:   Understand these instructions.  Will watch your condition.  Will get help right away if you are not doing well or get worse. Document Released: 10/20/2007 Document Revised: 10/31/2012 Document Reviewed: 08/22/2012 Exit[employer] Patient Information 2015 [employer]. This information is not intended to replace advice given to you by your health care provider. Make sure you discuss any questions you have with your health care provider.  

## 2014-01-11 NOTE — ED Notes (Signed)
Patient states that she had a sudden onset of sharp from her right flank area that radiates to her abd. Denies diarrhea, denies vomiting

## 2014-01-11 NOTE — ED Provider Notes (Signed)
CSN: [id number]     Arrival date & time 01/11/14  1413 History   First MD Initiated Contact with Patient 01/11/14 1527     Chief Complaint  Patient presents with  . Back Pain     (Consider location/radiation/quality/duration/timing/severity/associated sxs/prior Treatment) HPI   23 year old female with prior history of kidney infection and urinary tract infection who presents complaining of pain to her right flank. Patient states today while walking she experiencing a sharp shooting pain from her right flank that radiates to her low abdomen. Pain is intermittent, lasting for seconds to minutes and once it was intense she felt nauseous. There is no associated fever, chills, chest pain, shortness of breath, productive cough, dysuria, hematuria, vaginal bleeding, or rash. Patient has noticed some increased vaginal discharge without any strong odor. She admits to having history of vaginal vaginosis in the past but denies any other STD. She denies any pain with sexual activities. She denies any heavy lifting or any strenuous activities. She denies any recent trauma. No specific treatment tried. Patient states her pain has improved since been in the ER. She suspected may be due to gas. She denies any constipation or trouble with flatus.  Past Medical History  Diagnosis Date  . UTI (lower urinary tract infection)   . Renal disorder     kidney infections   Past Surgical History  Procedure Laterality Date  . Knee surgery     No family history on file. History  Substance Use Topics  . Smoking status: Current Every Day Smoker -- 0.50 packs/day    Types: Cigarettes  . Smokeless tobacco: Not on file  . Alcohol Use: Yes   OB History    No data available     Review of Systems  All other systems reviewed and are negative.     Allergies  Review of patient's allergies indicates no known allergies.  Home Medications   Prior to Admission medications   Medication Sig Start Date End Date  Taking? Authorizing Provider  amoxicillin (AMOXIL) 500 MG capsule Take 1 capsule (500 mg total) by mouth 3 (three) times daily. 03/18/13   Teressa LowerVrinda Pickering, NP  ciprofloxacin (CILOXAN) 0.3 % ophthalmic solution Place 1 drop into the right eye every 2 (two) hours. Administer 1 drop, every 2 hours, while awake, for 2 days. Then 1 drop, every 4 hours, while awake, for the next 5 days. 03/13/13   Elson AreasLeslie K Sofia, PA-C  erythromycin ophthalmic ointment 1 application 3 (three) times daily.    Historical Provider, MD   BP 120/80 mmHg  Pulse 52  Temp(Src) 97.7 F (36.5 C) (Oral)  Resp 18  Ht 5\' 8"  (1.727 m)  Wt 152 lb (68.947 kg)  BMI 23.12 kg/m2  SpO2 100% Physical Exam  Constitutional: She appears well-developed and well-nourished. No distress.  HENT:  Head: Atraumatic.  Eyes: Conjunctivae are normal.  Neck: Neck supple.  Cardiovascular: Normal rate and regular rhythm.   Pulmonary/Chest: Effort normal and breath sounds normal.  Abdominal: Soft. Bowel sounds are normal. She exhibits no distension. There is tenderness (Mild suprapubic tenderness to palpation without guarding or rebound tenderness. Negative Murphy sign, no pain at McBurney's point, no peritonitis.).  Genitourinary:  Chaperone present: No inguinal lymphadenopathy, normal external genitalia, no pain with speculum insertion. Mild functional white vaginal discharge noted in vaginal vault without any significant signs of infection. Cervical os is visualized and closed. On bimanual examination, and no adnexal tenderness or cervical motion tenderness.  Musculoskeletal: She exhibits no tenderness (no  significant midline spine tenderness crepitus or step-off).  Mild right lower back pain on palpation.    Neurological: She is alert.  Skin: No rash noted.  Psychiatric: She has a normal mood and affect.  Nursing note and vitals reviewed.   ED Course  Procedures (including critical care time)  5:04 PM Patient presents complaining of  right flank pain that radiates to her abdomen, pain has resolved. On exam patient has no focal point tenderness except mild suprapubic tenderness. Upper to test is negative and her urine shows no evidence of urinary tract infection. A pelvic examination without evidence to suggest PID. Wet prep with moderate clue cells. Giving her increased vaginal discharge and clue cells plan to treat patient for bacterial vaginosis. Culture sent. Patient reports she has had history of gonorrhea and Chlamydia in the past. I do not think she has appendicitis or kidney stones at this time. She is able to ambulate without difficulty. Will provide ibuprofen to use as needed. Return precautions discussed. Patient voiced understanding and agrees with plan.  Labs Review Labs Reviewed  WET PREP, GENITAL - Abnormal; Notable for the following:    Clue Cells Wet Prep HPF POC MODERATE (*)    WBC, Wet Prep HPF POC FEW (*)    All other components within normal limits  URINALYSIS, ROUTINE W REFLEX MICROSCOPIC - Abnormal; Notable for the following:    APPearance CLOUDY (*)    All other components within normal limits  GC/CHLAMYDIA PROBE AMP  PREGNANCY, URINE    Imaging Review No results found.   EKG Interpretation None      MDM   Final diagnoses:  Right-sided low back pain without sciatica  BV (bacterial vaginosis)    BP 120/80 mmHg  Pulse 52  Temp(Src) 97.7 F (36.5 C) (Oral)  Resp 18  Ht 5\' 8"  (1.727 m)  Wt 152 lb (68.947 kg)  BMI 23.12 kg/m2  SpO2 100%     Fayrene HelperBowie Shaquina Gillham, PA-C 01/11/14 1710  Toy CookeyMegan Docherty, MD 01/12/14 0003

## 2014-01-13 LAB — GC/CHLAMYDIA PROBE AMP
CT Probe RNA: NEGATIVE
GC Probe RNA: NEGATIVE

## 2015-04-04 ENCOUNTER — Encounter (HOSPITAL_BASED_OUTPATIENT_CLINIC_OR_DEPARTMENT_OTHER): Payer: Self-pay | Admitting: Emergency Medicine

## 2015-04-04 DIAGNOSIS — F17210 Nicotine dependence, cigarettes, uncomplicated: Secondary | ICD-10-CM | POA: Insufficient documentation

## 2015-04-04 DIAGNOSIS — Z87448 Personal history of other diseases of urinary system: Secondary | ICD-10-CM | POA: Insufficient documentation

## 2015-04-04 DIAGNOSIS — Z8742 Personal history of other diseases of the female genital tract: Secondary | ICD-10-CM | POA: Insufficient documentation

## 2015-04-04 DIAGNOSIS — R102 Pelvic and perineal pain: Secondary | ICD-10-CM | POA: Insufficient documentation

## 2015-04-04 DIAGNOSIS — Z3202 Encounter for pregnancy test, result negative: Secondary | ICD-10-CM | POA: Insufficient documentation

## 2015-04-04 DIAGNOSIS — N941 Unspecified dyspareunia: Secondary | ICD-10-CM | POA: Insufficient documentation

## 2015-04-04 DIAGNOSIS — Z202 Contact with and (suspected) exposure to infections with a predominantly sexual mode of transmission: Secondary | ICD-10-CM | POA: Insufficient documentation

## 2015-04-04 DIAGNOSIS — Z87440 Personal history of urinary (tract) infections: Secondary | ICD-10-CM | POA: Insufficient documentation

## 2015-04-04 DIAGNOSIS — Z792 Long term (current) use of antibiotics: Secondary | ICD-10-CM | POA: Insufficient documentation

## 2015-04-04 LAB — URINALYSIS, ROUTINE W REFLEX MICROSCOPIC
BILIRUBIN URINE: NEGATIVE
Glucose, UA: NEGATIVE mg/dL
HGB URINE DIPSTICK: NEGATIVE
KETONES UR: NEGATIVE mg/dL
Nitrite: NEGATIVE
PH: 8 (ref 5.0–8.0)
Protein, ur: NEGATIVE mg/dL
SPECIFIC GRAVITY, URINE: 1.02 (ref 1.005–1.030)

## 2015-04-04 LAB — URINE MICROSCOPIC-ADD ON

## 2015-04-04 LAB — PREGNANCY, URINE: Preg Test, Ur: NEGATIVE

## 2015-04-04 NOTE — ED Notes (Signed)
Patient reports that she was supposed to have started her period 2 days ago. The patient reports that she is having cramping on her left pelvic region.

## 2015-04-05 ENCOUNTER — Emergency Department (HOSPITAL_BASED_OUTPATIENT_CLINIC_OR_DEPARTMENT_OTHER)
Admission: EM | Admit: 2015-04-05 | Discharge: 2015-04-05 | Disposition: A | Discharge status: Home / Self Care | Payer: PRIVATE HEALTH INSURANCE | Attending: Emergency Medicine | Admitting: Emergency Medicine

## 2015-04-05 DIAGNOSIS — R102 Pelvic and perineal pain: Secondary | ICD-10-CM

## 2015-04-05 DIAGNOSIS — Z711 Person with feared health complaint in whom no diagnosis is made: Secondary | ICD-10-CM

## 2015-04-05 HISTORY — DX: Unspecified ovarian cyst, unspecified side: N83.209

## 2015-04-05 LAB — WET PREP, GENITAL
Sperm: NONE SEEN
Trich, Wet Prep: NONE SEEN
Yeast Wet Prep HPF POC: NONE SEEN

## 2015-04-05 MED ORDER — CEFTRIAXONE SODIUM 250 MG IJ SOLR
250.0000 mg | Freq: Once | INTRAMUSCULAR | Status: AC
  Administered 2015-04-05: 250 mg via INTRAMUSCULAR
  Filled 2015-04-05: qty 250

## 2015-04-05 MED ORDER — AZITHROMYCIN 250 MG PO TABS
1000.0000 mg | ORAL_TABLET | Freq: Once | ORAL | Status: AC
  Administered 2015-04-05: 1000 mg via ORAL
  Filled 2015-04-05: qty 4

## 2015-04-05 MED ORDER — NAPROXEN 500 MG PO TABS: 500.0000 mg | ORAL_TABLET | Freq: Two times a day (BID) | ORAL | Status: AC

## 2015-04-05 NOTE — ED Provider Notes (Signed)
CSN: [id number]     Arrival date & time 04/04/15  2307 History   First MD Initiated Contact with Patient 04/05/15 0322     Chief Complaint  Patient presents with  . Pelvic Pain     (Consider location/radiation/quality/duration/timing/severity/associated sxs/prior Treatment) HPI  This is a 24 rolled female who presents with pelvic pain. Reports left greater than right crampy lower abdominal pain onset 2 days ago. She reports that she was supposed to start. 2 days ago. She thought she was about to start. She had the cramping and some bloody discharge. She's not taken anything for the pain. Current pain is 3 out of 10. She does report some dyspareunia. Reports one sexual contact but does not use protection. Denies any urine or symptoms, nausea, vomiting, diarrhea, fever.  Past Medical History  Diagnosis Date  . UTI (lower urinary tract infection)   . Renal disorder     kidney infections  . Ovarian cyst    Past Surgical History  Procedure Laterality Date  . Knee surgery     History reviewed. No pertinent family history. Social History  Substance Use Topics  . Smoking status: Current Every Day Smoker -- 0.50 packs/day    Types: Cigarettes  . Smokeless tobacco: None  . Alcohol Use: Yes   OB History    No data available     Review of Systems  Constitutional: Negative for fever.  Gastrointestinal: Negative for nausea, vomiting and abdominal pain.  Genitourinary: Positive for pelvic pain and dyspareunia. Negative for dysuria, vaginal bleeding, vaginal discharge and difficulty urinating.  All other systems reviewed and are negative.     Allergies  Review of patient's allergies indicates no known allergies.  Home Medications   Prior to Admission medications   Medication Sig Start Date End Date Taking? Authorizing Provider  amoxicillin (AMOXIL) 500 MG capsule Take 1 capsule (500 mg total) by mouth 3 (three) times daily. 03/18/13   Teressa Lower, NP  ciprofloxacin (CILOXAN)  0.3 % ophthalmic solution Place 1 drop into the right eye every 2 (two) hours. Administer 1 drop, every 2 hours, while awake, for 2 days. Then 1 drop, every 4 hours, while awake, for the next 5 days. 03/13/13   Elson Areas, PA-C  erythromycin ophthalmic ointment 1 application 3 (three) times daily.    Historical Provider, MD  ibuprofen (ADVIL,MOTRIN) 400 MG tablet Take 1 tablet (400 mg total) by mouth every 6 (six) hours as needed. 01/11/14   Fayrene Helper, PA-C  metroNIDAZOLE (FLAGYL) 500 MG tablet Take 1 tablet (500 mg total) by mouth 2 (two) times daily. 01/11/14   Fayrene Helper, PA-C  naproxen (NAPROSYN) 500 MG tablet Take 1 tablet (500 mg total) by mouth 2 (two) times daily. 04/05/15   Shon Baton, MD   BP 151/90 mmHg  Pulse 86  Temp(Src) 98.7 F (37.1 C) (Oral)  Resp 16  Ht  (1.727 m)  Wt 150 lb (68.04 kg)  BMI 22.81 kg/m2  SpO2 97%  LMP 03/04/2015 Physical Exam  Constitutional: She is oriented to person, place, and time. She appears well-developed and well-nourished. No distress.  HENT:  Head: Normocephalic and atraumatic.  Cardiovascular: Normal rate, regular rhythm and normal heart sounds.   Pulmonary/Chest: Effort normal. No respiratory distress. She has no wheezes.  Abdominal: Soft. Bowel sounds are normal. There is no tenderness. There is no rebound.  Genitourinary: Vagina normal.  Normal external vaginal exam, scant vaginal discharge, no bleeding noted, no adnexal tenderness or mass noted,  no cervical motion tenderness  Neurological: She is alert and oriented to person, place, and time.  Skin: Skin is warm and dry.  Psychiatric: She has a normal mood and affect.  Nursing note and vitals reviewed.   ED Course  Procedures (including critical care time) Labs Review Labs Reviewed  WET PREP, GENITAL - Abnormal; Notable for the following:    Clue Cells Wet Prep HPF POC PRESENT (*)    WBC, Wet Prep HPF POC FEW (*)    All other components within normal limits   URINALYSIS, ROUTINE W REFLEX MICROSCOPIC (NOT AT [facility]) - Abnormal; Notable for the following:    APPearance CLOUDY (*)    Leukocytes, UA TRACE (*)    All other components within normal limits  URINE MICROSCOPIC-ADD ON - Abnormal; Notable for the following:    Squamous Epithelial / LPF 6-30 (*)    Bacteria, UA MANY (*)    All other components within normal limits  PREGNANCY, URINE  HIV ANTIBODY (ROUTINE TESTING)  RPR  GC/CHLAMYDIA PROBE AMP ([place]) NOT AT [facility]    Imaging Review No results found. I have personally reviewed and evaluated these images and lab results as part of my medical decision-making.   EKG Interpretation None      MDM   Final diagnoses:  Concern about STD in female without diagnosis  Pelvic pain in female    Patient presents with pelvic pain left greater than right. Nontoxic-appearing. No significant tenderness on exam. She is not pregnant. Pelvic exam is largely unremarkable. She was empirically treated for STDs. Prior to full wet prep coming back, patient stated that she needed to leave. At this time I have low suspicion for ovarian torsion. Discussed return precautions with the patient.  Naproxen twice a day as needed for pain. She may need follow-up for formal ultrasound if pain persists.  After history, exam, and medical workup I feel the patient has been appropriately medically screened and is safe for discharge home. Pertinent diagnoses were discussed with the patient. Patient was given return precautions.     Shon Batonourtney F Sherryann Frese, MD 04/06/15 [phone]

## 2015-04-05 NOTE — ED Notes (Signed)
Alert, NAD, calm, interactive, resps e/u, speaking in clear complete sentences. (denies: pain, nvd, fever, d/c itching, urinary or vaginal sx, back or abd pain), admits to "some mild cramping, more intense than usual, feels like i'm suppose to start period". No meds PTA. Admits to 5-6d of constipation ~ 1 week ago.

## 2015-04-05 NOTE — ED Notes (Signed)
Dr. Horton at [facility].  

## 2015-04-05 NOTE — ED Notes (Signed)
[facility]

## 2015-04-05 NOTE — Discharge Instructions (Signed)
You were seen today for pelvic pain. Your workup is reassuring. You were evaluated for STDs. He should abstain from sexual activity for the next 10 days.  Sexually Transmitted Disease A sexually transmitted disease (STD) is a disease or infection that may be passed (transmitted) from person to person, usually during sexual activity. This may happen by way of saliva, semen, blood, vaginal mucus, or urine. Common STDs include:  Gonorrhea.  Chlamydia.  Syphilis.  HIV and AIDS.  Genital herpes.  Hepatitis B and C.  Trichomonas.  Human papillomavirus (HPV).  Pubic lice.  Scabies.  Mites.  Bacterial vaginosis. WHAT ARE CAUSES OF STDs? An STD may be caused by bacteria, a virus, or parasites. STDs are often transmitted during sexual activity if one person is infected. However, they may also be transmitted through nonsexual means. STDs may be transmitted after:   Sexual intercourse with an infected person.  Sharing sex toys with an infected person.  Sharing needles with an infected person or using unclean piercing or tattoo needles.  Having intimate contact with the genitals, mouth, or rectal areas of an infected person.  Exposure to infected fluids during birth. WHAT ARE THE SIGNS AND SYMPTOMS OF STDs? Different STDs have different symptoms. Some people may not have any symptoms. If symptoms are present, they may include:  Painful or bloody urination.  Pain in the pelvis, abdomen, vagina, anus, throat, or eyes.  A skin rash, itching, or irritation.  Growths, ulcerations, blisters, or sores in the genital and anal areas.  Abnormal vaginal discharge with or without bad odor.  Penile discharge in men.  Fever.  Pain or bleeding during sexual intercourse.  Swollen glands in the groin area.  Yellow skin and eyes (jaundice). This is seen with hepatitis.  Swollen testicles.  Infertility.  Sores and blisters in the mouth. HOW ARE STDs DIAGNOSED? To make a  diagnosis, your health care provider may:  Take a medical history.  Perform a physical exam.  Take a sample of any discharge to examine.  Swab the throat, cervix, opening to the penis, rectum, or vagina for testing.  Test a sample of your first morning urine.  Perform blood tests.  Perform a Pap test, if this applies.  Perform a colposcopy.  Perform a laparoscopy. HOW ARE STDs TREATED? Treatment depends on the STD. Some STDs may be treated but not cured.  Chlamydia, gonorrhea, trichomonas, and syphilis can be cured with antibiotic medicine.  Genital herpes, hepatitis, and HIV can be treated, but not cured, with prescribed medicines. The medicines lessen symptoms.  Genital warts from HPV can be treated with medicine or by freezing, burning (electrocautery), or surgery. Warts may come back.  HPV cannot be cured with medicine or surgery. However, abnormal areas may be removed from the cervix, vagina, or vulva.  If your diagnosis is confirmed, your recent sexual partners need treatment. This is true even if they are symptom-free or have a negative culture or evaluation. They should not have sex until their health care providers say it is okay.  Your health care provider may test you for infection again 3 months after treatment. HOW CAN I REDUCE MY RISK OF GETTING AN STD? Take these steps to reduce your risk of getting an STD:  Use latex condoms, dental dams, and water-soluble lubricants during sexual activity. Do not use petroleum jelly or oils.  Avoid having multiple sex partners.  Do not have sex with someone who has other sex partners  Do not have sex with anyone  you do not know or who is at high risk for an STD.  Avoid risky sex practices that can break your skin.  Do not have sex if you have open sores on your mouth or skin.  Avoid drinking too much alcohol or taking illegal drugs. Alcohol and drugs can affect your judgment and put you in a vulnerable  position.  Avoid engaging in oral and anal sex acts.  Get vaccinated for HPV and hepatitis. If you have not received these vaccines in the past, talk to your health care provider about whether one or both might be right for you.  If you are at risk of being infected with HIV, it is recommended that you take a prescription medicine daily to prevent HIV infection. This is called pre-exposure prophylaxis (PrEP). You are considered at risk if:  You are a man who has sex with other men (MSM).  You are a heterosexual man or woman and are sexually active with more than one partner.  You take drugs by injection.  You are sexually active with a partner who has HIV.  Talk with your health care provider about whether you are at high risk of being infected with HIV. If you choose to begin PrEP, you should first be tested for HIV. You should then be tested every 3 months for as long as you are taking PrEP. WHAT SHOULD I DO IF I THINK I HAVE AN STD?  See your health care provider.  Tell your sexual partner(s). They should be tested and treated for any STDs.  Do not have sex until your health care provider says it is okay. WHEN SHOULD I GET IMMEDIATE MEDICAL CARE? Contact your health care provider right away if:   You have severe abdominal pain.  You are a man and notice swelling or pain in your testicles.  You are a woman and notice swelling or pain in your vagina.   This information is not intended to replace advice given to you by your health care provider. Make sure you discuss any questions you have with your health care provider.   Document Released: 04/02/2002 Document Revised: 01/31/2014 Document Reviewed: 07/31/2012 Elsevier Interactive Patient Education 2016 Elsevier Inc. Pelvic Pain, Female Female pelvic pain can be caused by many different things and start from a variety of places. Pelvic pain refers to pain that is located in the lower half of the abdomen and between your hips.  The pain may occur over a short period of time (acute) or may be reoccurring (chronic). The cause of pelvic pain may be related to disorders affecting the female reproductive organs (gynecologic), but it may also be related to the bladder, kidney stones, an intestinal complication, or muscle or skeletal problems. Getting help right away for pelvic pain is important, especially if there has been severe, sharp, or a sudden onset of unusual pain. It is also important to get help right away because some types of pelvic pain can be life threatening.  CAUSES  Below are only some of the causes of pelvic pain. The causes of pelvic pain can be in one of several categories.   Gynecologic.  Pelvic inflammatory disease.  Sexually transmitted infection.  Ovarian cyst or a twisted ovarian ligament (ovarian torsion).  Uterine lining that grows outside the uterus (endometriosis).  Fibroids, cysts, or tumors.  Ovulation.  Pregnancy.  Pregnancy that occurs outside the uterus (ectopic pregnancy).  Miscarriage.  Labor.  Abruption of the placenta or ruptured uterus.  Infection.  Uterine  infection (endometritis).  Bladder infection.  Diverticulitis.  Miscarriage related to a uterine infection (septic abortion).  Bladder.  Inflammation of the bladder (cystitis).  Kidney stone(s).  Gastrointestinal.  Constipation.  Diverticulitis.  Neurologic.  Trauma.  Feeling pelvic pain because of mental or emotional causes (psychosomatic).  Cancers of the bowel or pelvis. EVALUATION  Your caregiver will want to take a careful history of your concerns. This includes recent changes in your health, a careful gynecologic history of your periods (menses), and a sexual history. Obtaining your family history and medical history is also important. Your caregiver may suggest a pelvic exam. A pelvic exam will help identify the location and severity of the pain. It also helps in the evaluation of which  organ system may be involved. In order to identify the cause of the pelvic pain and be properly treated, your caregiver may order tests. These tests may include:   A pregnancy test.  Pelvic ultrasonography.  An X-ray exam of the abdomen.  A urinalysis or evaluation of vaginal discharge.  Blood tests. HOME CARE INSTRUCTIONS   Only take over-the-counter or prescription medicines for pain, discomfort, or fever as directed by your caregiver.   Rest as directed by your caregiver.   Eat a balanced diet.   Drink enough fluids to make your urine clear or pale yellow, or as directed.   Avoid sexual intercourse if it causes pain.   Apply warm or cold compresses to the lower abdomen depending on which one helps the pain.   Avoid stressful situations.   Keep a journal of your pelvic pain. Write down when it started, where the pain is located, and if there are things that seem to be associated with the pain, such as food or your menstrual cycle.  Follow up with your caregiver as directed.  SEEK MEDICAL CARE IF:  Your medicine does not help your pain.  You have abnormal vaginal discharge. SEEK IMMEDIATE MEDICAL CARE IF:   You have heavy bleeding from the vagina.   Your pelvic pain increases.   You feel light-headed or faint.   You have chills.   You have pain with urination or blood in your urine.   You have uncontrolled diarrhea or vomiting.   You have a fever or persistent symptoms for more than 3 days.  You have a fever and your symptoms suddenly get worse.   You are being physically or sexually abused.   This information is not intended to replace advice given to you by your health care provider. Make sure you discuss any questions you have with your health care provider.   Document Released: 12/08/2003 Document Revised: 10/01/2014 Document Reviewed: 05/02/2011 Elsevier Interactive Patient Education [employer] Else[employer] Inc[employer]

## 2015-04-06 LAB — GC/CHLAMYDIA PROBE AMP (~~LOC~~) NOT AT ARMC
Chlamydia: NEGATIVE
Neisseria Gonorrhea: NEGATIVE

## 2015-04-07 LAB — HIV ANTIBODY (ROUTINE TESTING W REFLEX): HIV SCREEN 4TH GENERATION: NONREACTIVE

## 2015-04-07 LAB — RPR: RPR Ser Ql: NONREACTIVE

## 2017-11-23 ENCOUNTER — Emergency Department (HOSPITAL_BASED_OUTPATIENT_CLINIC_OR_DEPARTMENT_OTHER): Payer: Self-pay

## 2017-11-23 ENCOUNTER — Encounter (HOSPITAL_BASED_OUTPATIENT_CLINIC_OR_DEPARTMENT_OTHER): Payer: Self-pay | Admitting: Emergency Medicine

## 2017-11-23 ENCOUNTER — Emergency Department (HOSPITAL_BASED_OUTPATIENT_CLINIC_OR_DEPARTMENT_OTHER)
Admission: EM | Admit: 2017-11-23 | Discharge: 2017-11-23 | Disposition: A | Discharge status: Home / Self Care | Payer: Self-pay | Attending: Emergency Medicine | Admitting: Emergency Medicine

## 2017-11-23 ENCOUNTER — Other Ambulatory Visit: Payer: Self-pay

## 2017-11-23 DIAGNOSIS — Z79899 Other long term (current) drug therapy: Secondary | ICD-10-CM | POA: Insufficient documentation

## 2017-11-23 DIAGNOSIS — Y9389 Activity, other specified: Secondary | ICD-10-CM | POA: Insufficient documentation

## 2017-11-23 DIAGNOSIS — Y999 Unspecified external cause status: Secondary | ICD-10-CM | POA: Insufficient documentation

## 2017-11-23 DIAGNOSIS — S93401A Sprain of unspecified ligament of right ankle, initial encounter: Secondary | ICD-10-CM | POA: Insufficient documentation

## 2017-11-23 DIAGNOSIS — Y9289 Other specified places as the place of occurrence of the external cause: Secondary | ICD-10-CM | POA: Insufficient documentation

## 2017-11-23 DIAGNOSIS — F17210 Nicotine dependence, cigarettes, uncomplicated: Secondary | ICD-10-CM | POA: Insufficient documentation

## 2017-11-23 DIAGNOSIS — X500XXA Overexertion from strenuous movement or load, initial encounter: Secondary | ICD-10-CM | POA: Insufficient documentation

## 2017-11-23 MED ORDER — IBUPROFEN 400 MG PO TABS
600.0000 mg | ORAL_TABLET | Freq: Once | ORAL | Status: AC
  Administered 2017-11-23: 600 mg via ORAL
  Filled 2017-11-23: qty 1

## 2017-11-23 NOTE — ED Triage Notes (Signed)
R ankle pain after twisting it this morning. Has an ace bandage in place.

## 2017-11-23 NOTE — ED Provider Notes (Signed)
[facility] EMERGENCY DEPARTMENT Provider Note   CSN: [id number] Arrival date & time: 11/23/17  1153     History   Chief Complaint Chief Complaint  Patient presents with  . Ankle Pain    HPI Karen Vincent is a 27 y.o. female.  Karen Vincent is a 27 y.o. Female with history of ovarian cyst, and previous pyelonephritis, who presents to the emergency department for evaluation of right ankle pain.  She reports this started after she was walking down the stairs and twisted her ankle, she did not fall, denies any other injury.  She reports since then she has had pain and swelling over the lateral aspect of the ankle that is worse with movement, palpation and weightbearing she is continued to walk on it did put a Ace bandage in place that was given to her by a friend with some improvement.  She has not taken anything for pain prior to arrival, denies any other aggravating or alleviating factors.  No pain at the knee or hip.  No prior injury or surgery to the ankle.  No associated laceration or abrasion, no numbness or tingling.     Past Medical History:  Diagnosis Date  . Ovarian cyst   . Renal disorder    kidney infections  . UTI (lower urinary tract infection)     There are no active problems to display for this patient.   Past Surgical History:  Procedure Laterality Date  . KNEE SURGERY       OB History   None      Home Medications    Prior to Admission medications   Medication Sig Start Date End Date Taking? Authorizing Provider  amoxicillin (AMOXIL) 500 MG capsule Take 1 capsule (500 mg total) by mouth 3 (three) times daily. 03/18/13   Teressa Lower, NP  ciprofloxacin (CILOXAN) 0.3 % ophthalmic solution Place 1 drop into the right eye every 2 (two) hours. Administer 1 drop, every 2 hours, while awake, for 2 days. Then 1 drop, every 4 hours, while awake, for the next 5 days. 03/13/13   Elson Areas, PA-C  erythromycin ophthalmic ointment 1  application 3 (three) times daily.    [provider]  ibuprofen (ADVIL,MOTRIN) 400 MG tablet Take 1 tablet (400 mg total) by mouth every 6 (six) hours as needed. 01/11/14   Fayrene Helper, PA-C  metroNIDAZOLE (FLAGYL) 500 MG tablet Take 1 tablet (500 mg total) by mouth 2 (two) times daily. 01/11/14   Fayrene Helper, PA-C  naproxen (NAPROSYN) 500 MG tablet Take 1 tablet (500 mg total) by mouth 2 (two) times daily. 04/05/15   Horton, Mayer Masker, MD    Family History No family history on file.  Social History Social History   Tobacco Use  . Smoking status: Current Every Day Smoker    Packs/day: 0.50    Types: Cigarettes  . Smokeless tobacco: Never Used  Substance Use Topics  . Alcohol use: Yes  . Drug use: Yes    Types: Marijuana     Allergies   Patient has no known allergies.   Review of Systems Review of Systems  Constitutional: Negative for chills and fever.  Musculoskeletal: Positive for arthralgias and joint swelling.  Skin: Negative for color change, rash and wound.  Neurological: Negative for weakness and numbness.     Physical Exam Updated Vital Signs BP 117/74 (BP Location: Left Arm)   Pulse 67   Temp 98.3 F (36.8 C) (Oral)   Resp  16   Ht 5\' 8"  (1.727 m)   Wt 68 kg   LMP 11/18/2017   SpO2 98%   BMI 22.81 kg/m   Physical Exam  Constitutional: She appears well-developed and well-nourished. No distress.  HENT:  Head: Normocephalic and atraumatic.  Eyes: Right eye exhibits no discharge. Left eye exhibits no discharge.  Pulmonary/Chest: Effort normal. No respiratory distress.  Musculoskeletal:  There is swelling and tenderness over the lateral malleolus.No overt deformity. No tenderness over the medial aspect of the ankle. The fifth metatarsal is not tender. The ankle joint is intact without excessive opening on stressing.  Neurological: She is alert. Coordination normal.  Skin: Skin is warm and dry. Capillary refill takes less than 2 seconds. She is  not diaphoretic.  Psychiatric: She has a normal mood and affect. Her behavior is normal.  Nursing note and vitals reviewed.    ED Treatments / Results  Labs (all labs ordered are listed, but only abnormal results are displayed) Labs Reviewed - No data to display  EKG None  Radiology Dg Ankle Complete Right  Result Date: 11/23/2017 CLINICAL DATA:  Twisting injury this morning with ankle pain, initial encounter EXAM: RIGHT ANKLE - COMPLETE 3+ VIEW COMPARISON:  None. FINDINGS: There is no evidence of fracture, dislocation, or joint effusion. There is no evidence of arthropathy or other focal bone abnormality. Soft tissues are unremarkable. IMPRESSION: No acute abnormality noted. Electronically Signed   By: Alcide Clever M.D.   On: 11/23/2017 12:27    Procedures Procedures (including critical care time)  Medications Ordered in ED Medications  ibuprofen (ADVIL,MOTRIN) tablet 600 mg (600 mg Oral Given 11/23/17 1255)     Initial Impression / Assessment and Plan / ED Course  I have reviewed the triage vital signs and the nursing notes.  Pertinent labs & imaging results that were available during my care of the patient were reviewed by me and considered in my medical decision making (see chart for details).  Presentation consistent with ankle sprain. Tenderness and swelling over lateral malleolus, pt is neurovascularly intact, and x-ray negative for fracture, and shows ankle mortise is intact. Pain treated in the ED. Pt placed in ASO brace and provided crutches, ambulated without difficulty. Pt stable for discharge home with ibuprofen for pain. Pt to follow-up with ortho in one week if symptoms not improving. Return precautions discussed, Pt expresses understanding and agrees with plan.   Final Clinical Impressions(s) / ED Diagnoses   Final diagnoses:  Sprain of right ankle, unspecified ligament, initial encounter    ED Discharge Orders    None       Dartha Lodge,  [place] 11/23/17 1258    Maia Plan, MD 11/24/17 [phone]

## 2017-11-23 NOTE — Discharge Instructions (Signed)
Your x-ray shows no evidence of fracture your pain is likely due to a mild ankle sprain please use ankle brace provided in the crutches you have at home try and stay off of the ankle as much as possible this will speed her recovery, ice and elevate, use Tylenol and ibuprofen for pain.  If after a week you are still having pain or swelling please follow-up with Dr. Pearletha Forge with sports medicine.  Return for significantly worsened pain, swelling, redness or warmth over the ankle, any numbness or weakness or any other new or concerning symptoms.

## 2018-07-29 ENCOUNTER — Encounter (HOSPITAL_BASED_OUTPATIENT_CLINIC_OR_DEPARTMENT_OTHER): Payer: Self-pay | Admitting: Emergency Medicine

## 2018-07-29 ENCOUNTER — Emergency Department (HOSPITAL_BASED_OUTPATIENT_CLINIC_OR_DEPARTMENT_OTHER)
Admission: EM | Admit: 2018-07-29 | Discharge: 2018-07-29 | Disposition: A | Discharge status: Home / Self Care | Payer: Medicaid Other | Attending: Emergency Medicine | Admitting: Emergency Medicine

## 2018-07-29 ENCOUNTER — Other Ambulatory Visit: Payer: Self-pay

## 2018-07-29 DIAGNOSIS — X17XXXA Contact with hot engines, machinery and tools, initial encounter: Secondary | ICD-10-CM | POA: Insufficient documentation

## 2018-07-29 DIAGNOSIS — F1210 Cannabis abuse, uncomplicated: Secondary | ICD-10-CM | POA: Insufficient documentation

## 2018-07-29 DIAGNOSIS — F17210 Nicotine dependence, cigarettes, uncomplicated: Secondary | ICD-10-CM | POA: Insufficient documentation

## 2018-07-29 DIAGNOSIS — Y929 Unspecified place or not applicable: Secondary | ICD-10-CM | POA: Insufficient documentation

## 2018-07-29 DIAGNOSIS — T24031A Burn of unspecified degree of right lower leg, initial encounter: Secondary | ICD-10-CM | POA: Insufficient documentation

## 2018-07-29 DIAGNOSIS — Y998 Other external cause status: Secondary | ICD-10-CM | POA: Insufficient documentation

## 2018-07-29 DIAGNOSIS — T300 Burn of unspecified body region, unspecified degree: Secondary | ICD-10-CM

## 2018-07-29 DIAGNOSIS — Y9355 Activity, bike riding: Secondary | ICD-10-CM | POA: Insufficient documentation

## 2018-07-29 MED ORDER — CEPHALEXIN 500 MG PO CAPS: 500.0000 mg | ORAL_CAPSULE | Freq: Two times a day (BID) | ORAL | 0 refills | Status: AC

## 2018-07-29 NOTE — Discharge Instructions (Signed)
Please continue to apply Neosporin to the area. I have also prescribed antibiotics to help treat the infection, please take 1 tablet twice a day for the next 7 days. Have your wound reassessed in the 3 days either with your primary care physician, UC or ED.
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
## 2018-07-29 NOTE — ED Notes (Signed)
ED Provider at bedside. 

## 2020-06-15 IMAGING — CR DG ANKLE COMPLETE 3+V*R*
3 series · 3 of 3 positions shown · non-contrast
Comparison: None.

CLINICAL DATA: Twisting injury this morning with ankle pain,
initial encounter

EXAM:
RIGHT ANKLE - COMPLETE 3+ VIEW

[t ankle joint ap right *]
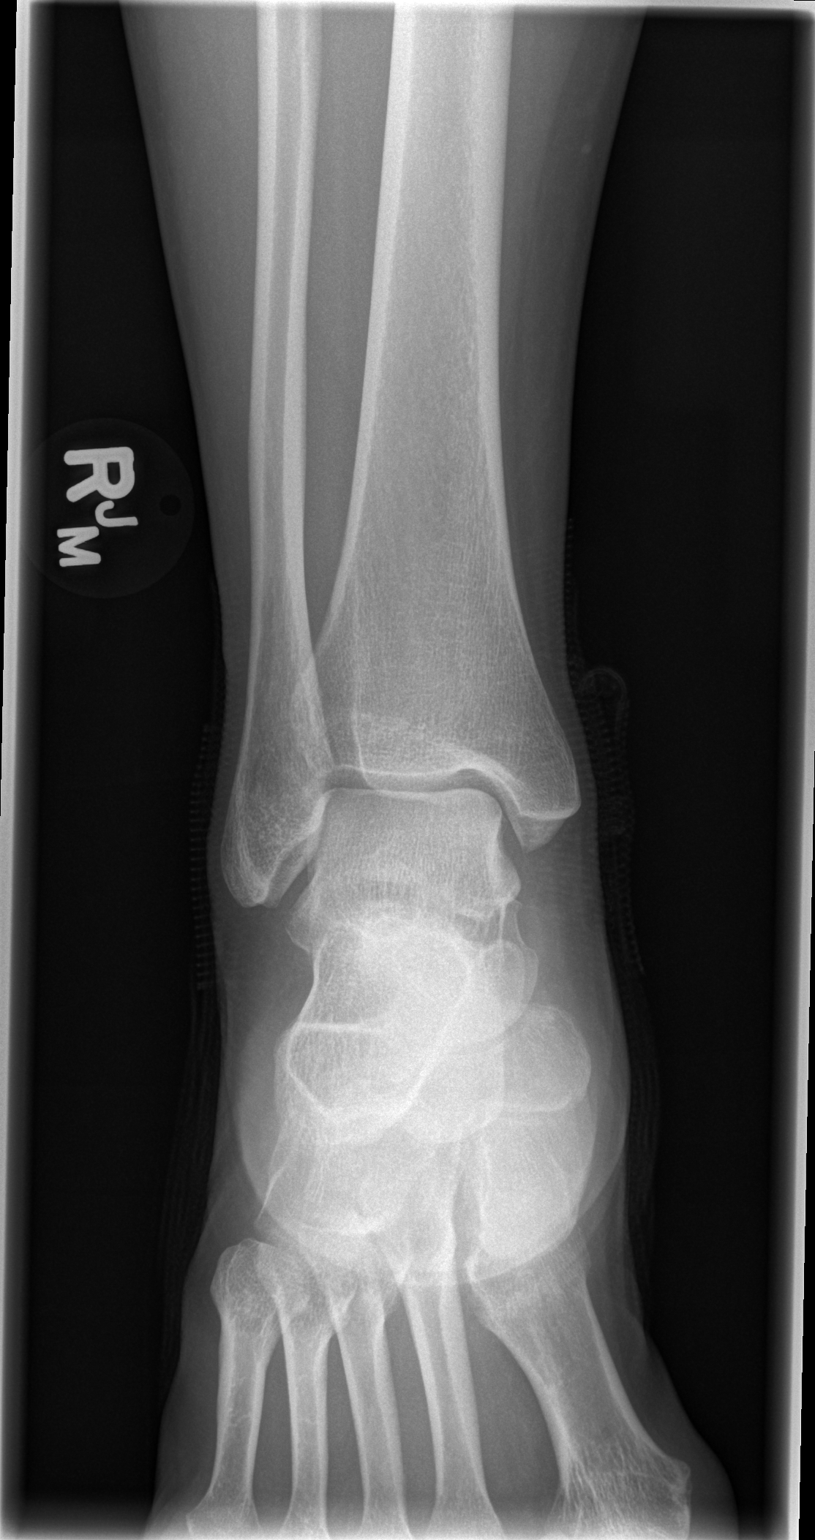

[t ankle joint oblique right]
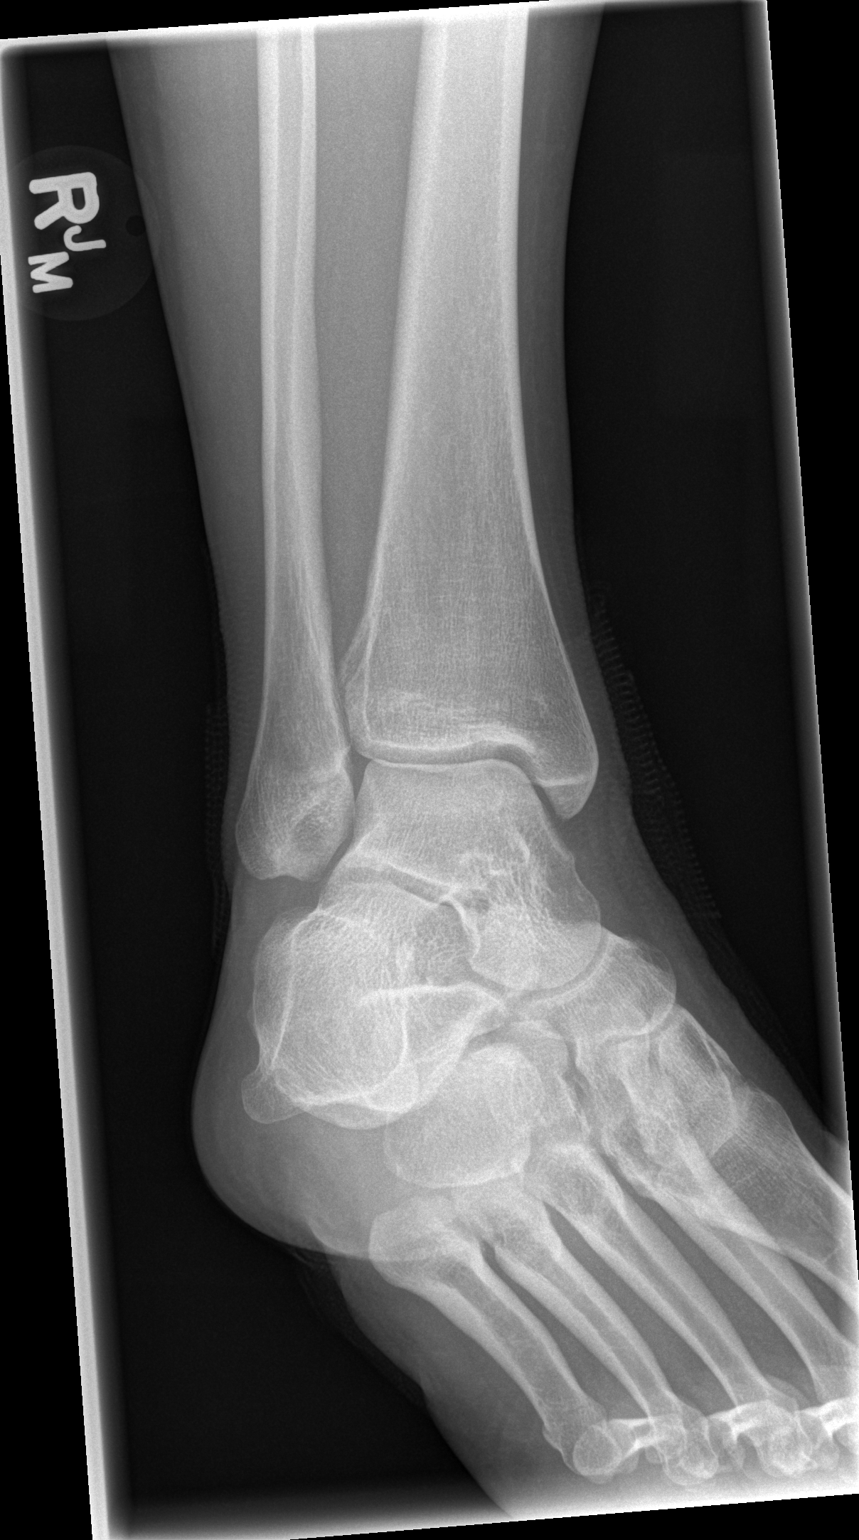

[t ankle joint lat right]
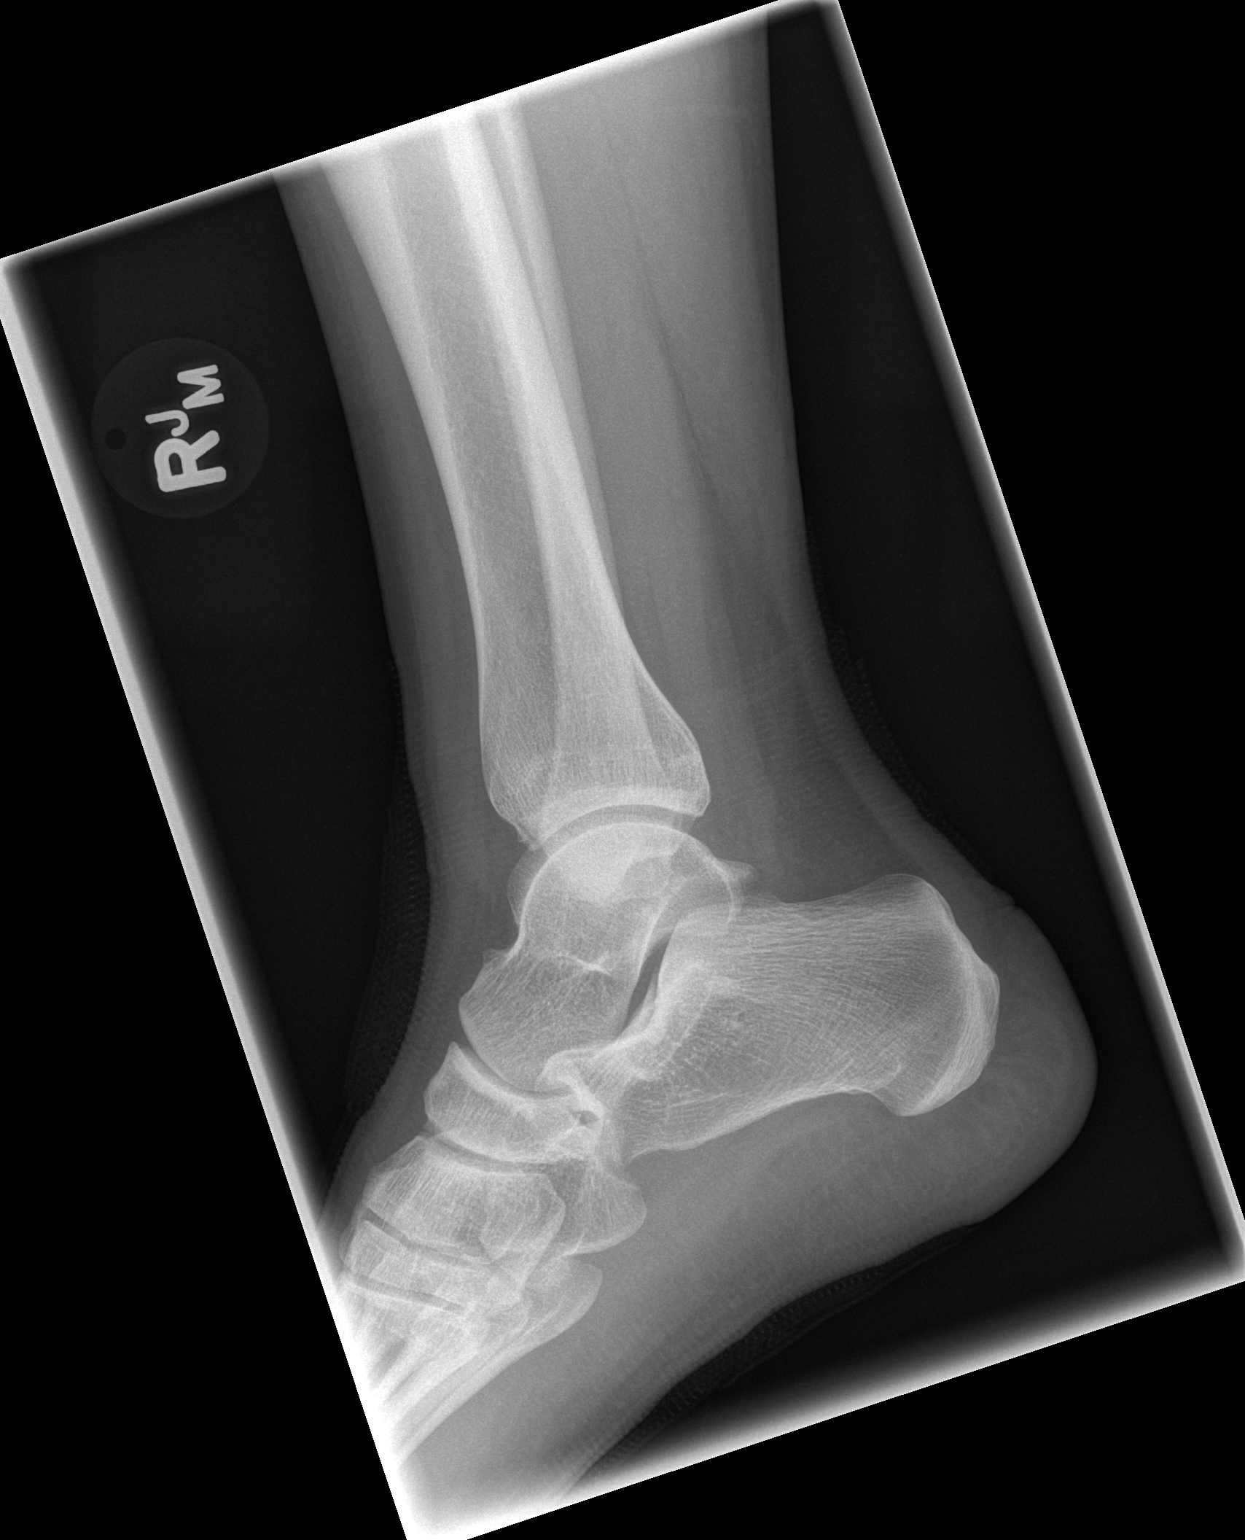

[3 of 3 positions shown; findings below may reference images not displayed]

FINDINGS: There is no evidence of fracture, dislocation, or joint effusion.
There is no evidence of arthropathy or other focal bone abnormality.
Soft tissues are unremarkable.
IMPRESSION: No acute abnormality noted.
